# Patient Record
Sex: Female | Born: 1960 | Race: Black or African American | Hispanic: No | Smoking: Former smoker
Health system: Southern US, Community
[De-identification: ages and names within clinical notes are randomized; demographics above are authoritative.]

## PROBLEM LIST (undated history)

## (undated) DIAGNOSIS — I219 Acute myocardial infarction, unspecified: Secondary | ICD-10-CM

## (undated) DIAGNOSIS — E119 Type 2 diabetes mellitus without complications: Secondary | ICD-10-CM

## (undated) DIAGNOSIS — I10 Essential (primary) hypertension: Secondary | ICD-10-CM

## (undated) HISTORY — PX: TUBAL LIGATION: SHX77

---

## 2017-10-15 ENCOUNTER — Other Ambulatory Visit: Payer: Self-pay

## 2017-10-15 ENCOUNTER — Emergency Department: Payer: Self-pay

## 2017-10-15 ENCOUNTER — Emergency Department
Admission: EM | Admit: 2017-10-15 | Discharge: 2017-10-15 | Disposition: A | Discharge status: Home / Self Care | Payer: Self-pay | Attending: Emergency Medicine | Admitting: Emergency Medicine

## 2017-10-15 DIAGNOSIS — I10 Essential (primary) hypertension: Secondary | ICD-10-CM | POA: Insufficient documentation

## 2017-10-15 DIAGNOSIS — E119 Type 2 diabetes mellitus without complications: Secondary | ICD-10-CM | POA: Insufficient documentation

## 2017-10-15 DIAGNOSIS — R531 Weakness: Secondary | ICD-10-CM

## 2017-10-15 DIAGNOSIS — Z87891 Personal history of nicotine dependence: Secondary | ICD-10-CM | POA: Insufficient documentation

## 2017-10-15 DIAGNOSIS — R519 Headache, unspecified: Secondary | ICD-10-CM

## 2017-10-15 DIAGNOSIS — R51 Headache: Secondary | ICD-10-CM | POA: Insufficient documentation

## 2017-10-15 DIAGNOSIS — I252 Old myocardial infarction: Secondary | ICD-10-CM | POA: Insufficient documentation

## 2017-10-15 HISTORY — DX: Acute myocardial infarction, unspecified: I21.9

## 2017-10-15 HISTORY — DX: Type 2 diabetes mellitus without complications: E11.9

## 2017-10-15 HISTORY — DX: Essential (primary) hypertension: I10

## 2017-10-15 LAB — TROPONIN I

## 2017-10-15 LAB — COMPREHENSIVE METABOLIC PANEL
ALT: 16 U/L (ref 14–54)
AST: 23 U/L (ref 15–41)
Albumin: 4.2 g/dL (ref 3.5–5.0)
Alkaline Phosphatase: 69 U/L (ref 38–126)
Anion gap: 11 (ref 5–15)
BILIRUBIN TOTAL: 0.6 mg/dL (ref 0.3–1.2)
BUN: 13 mg/dL (ref 6–20)
CO2: 24 mmol/L (ref 22–32)
CREATININE: 0.7 mg/dL (ref 0.44–1.00)
Calcium: 9.7 mg/dL (ref 8.9–10.3)
Chloride: 103 mmol/L (ref 101–111)
GFR calc Af Amer: >60 mL/min (ref >60)
Glucose, Bld: 189 mg/dL — ABNORMAL HIGH (ref 65–99)
POTASSIUM: 3.7 mmol/L (ref 3.5–5.1)
Sodium: 138 mmol/L (ref 135–145)
TOTAL PROTEIN: 8.6 g/dL — AB (ref 6.5–8.1)

## 2017-10-15 LAB — CBC
HEMATOCRIT: 38.9 % (ref 35.0–47.0)
Hemoglobin: 12.2 g/dL (ref 12.0–16.0)
MCH: 20.5 pg — AB (ref 26.0–34.0)
MCHC: 31.4 g/dL — AB (ref 32.0–36.0)
MCV: 65.4 fL — AB (ref 80.0–100.0)
Platelets: 180 10*3/uL (ref 150–440)
RBC: 5.95 MIL/uL — ABNORMAL HIGH (ref 3.80–5.20)
RDW: 16.8 % — ABNORMAL HIGH (ref 11.5–14.5)
WBC: 6.4 10*3/uL (ref 3.6–11.0)

## 2017-10-15 LAB — APTT: APTT: 30 s (ref 24–36)

## 2017-10-15 LAB — DIFFERENTIAL
BASOS ABS: 0.1 10*3/uL (ref 0–0.1)
Basophils Relative: 1 %
EOS ABS: 0.2 10*3/uL (ref 0–0.7)
Eosinophils Relative: 3 %
Lymphocytes Relative: 37 %
Lymphs Abs: 2.4 10*3/uL (ref 1.0–3.6)
MONO ABS: 0.6 10*3/uL (ref 0.2–0.9)
MONOS PCT: 9 %
Neutro Abs: 3.2 10*3/uL (ref 1.4–6.5)
Neutrophils Relative %: 50 %

## 2017-10-15 LAB — PROTIME-INR
INR: 1
Prothrombin Time: 13.1 seconds (ref 11.4–15.2)

## 2017-10-15 LAB — GLUCOSE, CAPILLARY: Glucose-Capillary: 182 mg/dL — ABNORMAL HIGH (ref 65–99)

## 2017-10-15 MED ORDER — PROCHLORPERAZINE EDISYLATE 5 MG/ML IJ SOLN
10.0000 mg | Freq: Once | INTRAMUSCULAR | Status: AC
  Administered 2017-10-15: 10 mg via INTRAVENOUS
  Filled 2017-10-15: qty 2

## 2017-10-15 MED ORDER — SODIUM CHLORIDE 0.9 % IV BOLUS (SEPSIS)
1000.0000 mL | Freq: Once | INTRAVENOUS | Status: AC
  Administered 2017-10-15: 1000 mL via INTRAVENOUS

## 2017-10-15 NOTE — ED Triage Notes (Signed)
Pt states L sided weakness from "head to toes." noticed when she woke up at 2am. States HA x 2 days. States has been talking aleeve States blurred vision while driving.   Alert, oriented, no speech problems noted. bilat grip strong. Able to raise both arms and keep them up. L leg falls back to bed but able to pick it back up again. No problems noted with R leg.   Takes 81mg  asa.

## 2017-10-15 NOTE — ED Notes (Signed)
Patient states her periods stopped when she was 51 and hasn't had intercourse in 6 years.

## 2017-10-15 NOTE — ED Notes (Signed)
Patient is back from CT scan.

## 2017-10-15 NOTE — ED Notes (Signed)
Pt states she takes BP medications but has been out of them x 1 month.

## 2017-10-15 NOTE — Discharge Instructions (Signed)
Please seek medical attention for any high fevers, chest pain, shortness of breath, change in behavior, persistent vomiting, bloody stool or any other new or concerning symptoms.  

## 2017-10-15 NOTE — ED Notes (Signed)
Patient ambulated with tech. No dyspnea or problems reported.

## 2017-10-15 NOTE — ED Provider Notes (Signed)
[facility] Emergency Department Provider Note  ____________________________________________   I have reviewed the triage vital signs and the nursing notes.   HISTORY  Chief Complaint Weakness and Hypertension   History limited by: Not Limited   HPI Penny Medina is a 57 y.o. female who presents to the emergency department today because of concern for headache and left sided weakness. The patient states that the headaches started two days ago. Initially it was located behind her eyes but it has since moved to the top of her head. It has increasingly gotten more severe. She tried taking OTC pain medications for it but has not gotten relief. Last time she had a headache this severe was in 2006. She states that this morning at 2 am she woke up and noticed that she had difficulty getting out of bed and walking because of left sided weakness.   Per medical record review patient has a history of DM, MI, HTN.  Past Medical History:  Diagnosis Date  . Diabetes mellitus without complication (HCC)   . Heart attack (HCC)   . Hypertension     There are no active problems to display for this patient.   Past Surgical History:  Procedure Laterality Date  . CESAREAN SECTION    . TUBAL LIGATION      Prior to Admission medications   Not on File    Allergies Penicillins  History reviewed. No pertinent family history.  Social History Social History   Tobacco Use  . Smoking status: Former Smoker  Substance Use Topics  . Alcohol use: No    Frequency: Never  . Drug use: Not on file    Review of Systems Constitutional: No fever/chills Eyes: No visual changes. ENT: No sore throat. Cardiovascular: Denies chest pain. Respiratory: Denies shortness of breath. Gastrointestinal: No abdominal pain.  No nausea, no vomiting.  No diarrhea.   Genitourinary: Negative for dysuria. Musculoskeletal: Negative for back pain. Skin: Negative for rash. Neurological:  Positive for headache, left sided weakness.  ____________________________________________   PHYSICAL EXAM:  VITAL SIGNS: ED Triage Vitals  Enc Vitals Group     BP 10/15/17 1239 (!) 214/119     Pulse Rate 10/15/17 1239 91     Resp 10/15/17 1239 13     Temp 10/15/17 1239 98.6 F (37 C)     Temp Source 10/15/17 1239 Oral     SpO2 10/15/17 1239 100 %     Weight 10/15/17 1235 278 lb 1.6 oz (126.1 kg)     Height 10/15/17 1235 5\' 5"  (1.651 m)     Head Circumference --      Peak Flow --      Pain Score 10/15/17 1235 10   Constitutional: Alert and oriented. Well appearing and in no distress. Eyes: Conjunctivae are normal.  ENT   Head: Normocephalic and atraumatic.   Nose: No congestion/rhinnorhea.   Mouth/Throat: Mucous membranes are moist.   Neck: No stridor. Hematological/Lymphatic/Immunilogical: No cervical lymphadenopathy. Cardiovascular: Normal rate, regular rhythm.  No murmurs, rubs, or gallops.  Respiratory: Normal respiratory effort without tachypnea nor retractions. Breath sounds are clear and equal bilaterally. No wheezes/rales/rhonchi. Gastrointestinal: Soft and non tender. No rebound. No guarding.  Genitourinary: Deferred Musculoskeletal: Normal range of motion in all extremities. No lower extremity edema. Neurologic:  Normal speech and language. No pronator drift. Strength 5/5 in right upper and lower extremities. 4+/5 in left upper and lower extremities. Sensation grossly intact.  Skin:  Skin is warm, dry and intact. No  rash noted. Psychiatric: Mood and affect are normal. Speech and behavior are normal. Patient exhibits appropriate insight and judgment.  ____________________________________________    LABS (pertinent positives/negatives)  Trop <0.03 CBC wbc 6.4, hgb 12.2, plt 180 CMP glu 189, cr 0.70  ____________________________________________   EKG  None  ____________________________________________    RADIOLOGY  CT head No acute  abnormality  ____________________________________________   PROCEDURES  Procedures  ____________________________________________   INITIAL IMPRESSION / ASSESSMENT AND PLAN / ED COURSE  Pertinent labs & imaging results that were available during my care of the patient were reviewed by me and considered in my medical decision making (see chart for details).  Resented to the emergency department today with concerns for severe headache as well as some left-sided numbness.  Patient has had a headache similar to this in the past although has been a number of years.  The patient was given IV fluids and medication.  This did help resolve the headache.  Patient stated she did feel better and the strength had returned to her arm and leg.  She was able to get up and walk around the room easily.  I did offer MRI to the patient to evaluate for stroke however she felt comfortable deferring.  We did discuss return precautions.  ___________________________________   FINAL CLINICAL IMPRESSION(S) / ED DIAGNOSES  Final diagnoses:  Bad headache  Left-sided weakness     Note: This dictation was prepared with Dragon dictation. Any transcriptional errors that result from this process are unintentional     Phineas SemenGoodman, Pretty Weltman, MD 10/15/17 [phone]

## 2017-10-23 ENCOUNTER — Emergency Department
Admission: EM | Admit: 2017-10-23 | Discharge: 2017-10-23 | Disposition: A | Discharge status: Home / Self Care | Payer: Self-pay | Attending: Emergency Medicine | Admitting: Emergency Medicine

## 2017-10-23 ENCOUNTER — Other Ambulatory Visit: Payer: Self-pay

## 2017-10-23 DIAGNOSIS — Z79899 Other long term (current) drug therapy: Secondary | ICD-10-CM | POA: Insufficient documentation

## 2017-10-23 DIAGNOSIS — I10 Essential (primary) hypertension: Secondary | ICD-10-CM | POA: Insufficient documentation

## 2017-10-23 DIAGNOSIS — L0291 Cutaneous abscess, unspecified: Secondary | ICD-10-CM

## 2017-10-23 DIAGNOSIS — E119 Type 2 diabetes mellitus without complications: Secondary | ICD-10-CM | POA: Insufficient documentation

## 2017-10-23 DIAGNOSIS — Z7982 Long term (current) use of aspirin: Secondary | ICD-10-CM | POA: Insufficient documentation

## 2017-10-23 DIAGNOSIS — Z7984 Long term (current) use of oral hypoglycemic drugs: Secondary | ICD-10-CM | POA: Insufficient documentation

## 2017-10-23 DIAGNOSIS — L02211 Cutaneous abscess of abdominal wall: Secondary | ICD-10-CM | POA: Insufficient documentation

## 2017-10-23 DIAGNOSIS — Z87891 Personal history of nicotine dependence: Secondary | ICD-10-CM | POA: Insufficient documentation

## 2017-10-23 LAB — CBC WITH DIFFERENTIAL/PLATELET
BASOS ABS: 0 10*3/uL (ref 0–0.1)
BASOS PCT: 1 %
Eosinophils Absolute: 0.2 10*3/uL (ref 0–0.7)
Eosinophils Relative: 2 %
HCT: 39.8 % (ref 35.0–47.0)
HEMOGLOBIN: 12.6 g/dL (ref 12.0–16.0)
Lymphocytes Relative: 28 %
Lymphs Abs: 2.2 10*3/uL (ref 1.0–3.6)
MCH: 21.1 pg — ABNORMAL LOW (ref 26.0–34.0)
MCHC: 31.7 g/dL — ABNORMAL LOW (ref 32.0–36.0)
MCV: 66.7 fL — ABNORMAL LOW (ref 80.0–100.0)
Monocytes Absolute: 0.7 10*3/uL (ref 0.2–0.9)
Monocytes Relative: 10 %
NEUTROS PCT: 59 %
Neutro Abs: 4.6 10*3/uL (ref 1.4–6.5)
Platelets: 185 10*3/uL (ref 150–440)
RBC: 5.97 MIL/uL — AB (ref 3.80–5.20)
RDW: 17.1 % — ABNORMAL HIGH (ref 11.5–14.5)
WBC: 7.7 10*3/uL (ref 3.6–11.0)

## 2017-10-23 LAB — BASIC METABOLIC PANEL
ANION GAP: 13 (ref 5–15)
BUN: 12 mg/dL (ref 6–20)
CALCIUM: 9.2 mg/dL (ref 8.9–10.3)
CHLORIDE: 104 mmol/L (ref 101–111)
CO2: 20 mmol/L — AB (ref 22–32)
Creatinine, Ser: 0.7 mg/dL (ref 0.44–1.00)
GFR calc non Af Amer: >60 mL/min (ref >60)
Glucose, Bld: 174 mg/dL — ABNORMAL HIGH (ref 65–99)
POTASSIUM: 3.8 mmol/L (ref 3.5–5.1)
Sodium: 137 mmol/L (ref 135–145)

## 2017-10-23 MED ORDER — CLINDAMYCIN HCL 150 MG PO CAPS: 300.0000 mg | ORAL_CAPSULE | Freq: Three times a day (TID) | ORAL | 0 refills | Status: DC

## 2017-10-23 MED ORDER — TRAMADOL HCL 50 MG PO TABS: 50.0000 mg | ORAL_TABLET | Freq: Four times a day (QID) | ORAL | 0 refills | Status: DC | PRN

## 2017-10-23 MED ORDER — CLINDAMYCIN PHOSPHATE 600 MG/50ML IV SOLN
600.0000 mg | Freq: Once | INTRAVENOUS | Status: AC
  Administered 2017-10-23: 600 mg via INTRAVENOUS
  Filled 2017-10-23: qty 50

## 2017-10-23 MED ORDER — KETOROLAC TROMETHAMINE 30 MG/ML IJ SOLN
30.0000 mg | Freq: Once | INTRAMUSCULAR | Status: AC
  Administered 2017-10-23: 30 mg via INTRAVENOUS
  Filled 2017-10-23: qty 1

## 2017-10-23 NOTE — ED Notes (Signed)
FIRST NURSE NOTE:  Pt states she has abscess or cyst to abdomen since Friday, pt states she used hot compress for sxs and states today it popped and started draining.

## 2017-10-23 NOTE — Discharge Instructions (Signed)
Follow-up with your regular doctor as a acute care if you are not better in 3-5 days.  Return to the emergency department if you are worsening.  Apply warm compress to the area.  Take medications as prescribed.

## 2017-10-23 NOTE — ED Triage Notes (Signed)
Pt states noticed abscess to LLQ (states belly but points to groin). States noticed it Friday. States swelling yesterday. States while driving here it burst and it drained. Denies fevers. Alert, oriented, ambulatory.

## 2017-10-23 NOTE — ED Notes (Signed)
See triage note  Presents with 2 possible abscess area to lower abd  Areas are starting to drain

## 2017-10-23 NOTE — ED Provider Notes (Signed)
[facility] Emergency Department Provider Note  ____________________________________________   First MD Initiated Contact with Patient 10/23/17 1416     (approximate)  I have reviewed the triage vital signs and the nursing notes.   HISTORY  Chief Complaint Abscess    HPI Penny Medina is a 57 y.o. female presents to the emergency department complaining of abscess to the abdomen.  She states the area started to drain earlier today.  She is not had any fever or chills.  She does have a lot of pain in this area.  She has a history of diabetes.  She is concerned that the area will continue to spread  Past Medical History:  Diagnosis Date  . Diabetes mellitus without complication ([place])   . Heart attack ([place])   . Hypertension     There are no active problems to display for this patient.   Past Surgical History:  Procedure Laterality Date  . CESAREAN SECTION    . TUBAL LIGATION      Prior to Admission medications   Medication Sig Start Date End Date Taking? Authorizing Provider  amLODipine (NORVASC) 10 MG tablet Take 10 mg by mouth daily.   Yes [provider]  aspirin 81 MG chewable tablet Chew 81 mg by mouth daily.   Yes [provider]  carvedilol (COREG) 12.5 MG tablet Take 12.5 mg by mouth 2 (two) times daily with a meal.   Yes [provider]  glimepiride (AMARYL) 2 MG tablet Take 2 mg by mouth daily with breakfast.   Yes [provider]  metFORMIN (GLUCOPHAGE) 500 MG tablet Take 500 mg by mouth 2 (two) times daily with a meal.   Yes [provider]  ranitidine (ZANTAC) 75 MG tablet Take 75 mg by mouth 2 (two) times daily.   Yes [provider]  clindamycin (CLEOCIN) 150 MG capsule Take 2 capsules (300 mg total) by mouth 3 (three) times daily. 10/23/17   Tela Kotecki, Linden Dolin, PA-C  traMADol (ULTRAM) 50 MG tablet Take 1 tablet (50 mg total) by mouth every 6 (six) hours as needed. 10/23/17   Versie Starks, PA-C    Allergies Penicillins and Sulfa antibiotics  History reviewed. No pertinent family history.  Social History Social History   Tobacco Use  . Smoking status: Former Smoker  Substance Use Topics  . Alcohol use: No    Frequency: Never  . Drug use: Not on file    Review of Systems  Constitutional: No fever/chills Eyes: No visual changes. ENT: No sore throat. Respiratory: Denies cough Genitourinary: Negative for dysuria. Musculoskeletal: Negative for back pain. Skin: Negative for rash.  Positive for abscess on the abdomen    ____________________________________________   PHYSICAL EXAM:  VITAL SIGNS: ED Triage Vitals  Enc Vitals Group     BP 10/23/17 1229 (!) 144/69     Pulse Rate 10/23/17 1229 89     Resp 10/23/17 1229 20     Temp 10/23/17 1229 98.8 F (37.1 C)     Temp Source 10/23/17 1229 Oral     SpO2 10/23/17 1229 100 %     Weight 10/23/17 1230 260 lb (117.9 kg)     Height 10/23/17 1230 _0  (1.651 m)     Head Circumference --      Peak Flow --      Pain Score 10/23/17 1232 6     Pain Loc --      Pain Edu? --  Excl. in [place]? --     Constitutional: Alert and oriented. Well appearing and in no acute distress. Eyes: Conjunctivae are normal.  Head: Atraumatic. Nose: No congestion/rhinnorhea. Mouth/Throat: Mucous membranes are moist.   Cardiovascular: Normal rate, regular rhythm.  Heart sounds are normal Respiratory: Normal respiratory effort.  No retractions, lungs are clear to auscultation Abdomen: There is a large abscess that is currently draining below the umbilicus in the fold of the pannus.  The area is tender to palpation GU: deferred Musculoskeletal: FROM all extremities, warm and well perfused Neurologic:  Normal speech and language.  Skin:  Skin is warm, dry and intact. No rash noted. Psychiatric: Mood and affect are normal. Speech and behavior are normal.  ____________________________________________   LABS (all labs  ordered are listed, but only abnormal results are displayed)  Labs Reviewed  CBC WITH DIFFERENTIAL/PLATELET - Abnormal; Notable for the following components:      Result Value   RBC 5.97 (*)    MCV 66.7 (*)    MCH 21.1 (*)    MCHC 31.7 (*)    RDW 17.1 (*)    All other components within normal limits  BASIC METABOLIC PANEL - Abnormal; Notable for the following components:   CO2 20 (*)    Glucose, Bld 174 (*)    All other components within normal limits   ____________________________________________   ____________________________________________  RADIOLOGY    ____________________________________________   PROCEDURES  Procedure(s) performed: Saline lock, clindamycin 600 mg IV, Toradol 30 mg IV  Procedures    ____________________________________________   INITIAL IMPRESSION / ASSESSMENT AND PLAN / ED COURSE  Pertinent labs & imaging results that were available during my care of the patient were reviewed by me and considered in my medical decision making (see chart for details).  Patient is a 57 year old female presents emergency department complaining of an abscess on the lower abdomen  On physical exam there is a large red swollen tender area with an obvious abscess that is currently draining  Saline lock, clindamycin 600 mg IV and Toradol 30 mg IV were given to the patient.  CBC has a normal white count, the met B has normal kidney functions and her glucose was slightly elevated but within normal limits for the patient  Results were discussed with the patient.  She was given a prescription for clindamycin and tramadol.  She is to follow-up with her regular doctor if not improving in 3 days.  If she is worsening she should return to the emergency department.  She is to apply a dressing to absorb the drainage.  She states she understands will comply with our instructions.  She was discharged in stable condition     As part of my medical decision making, I reviewed  the following data within the [place] notes reviewed and incorporated, Labs reviewed CBC and met B are basically normal, Notes from prior ED visits and [place] Controlled Substance Database  ____________________________________________   FINAL CLINICAL IMPRESSION(S) / ED DIAGNOSES  Final diagnoses:  Abscess      NEW MEDICATIONS STARTED DURING THIS VISIT:  New Prescriptions   CLINDAMYCIN (CLEOCIN) 150 MG CAPSULE    Take 2 capsules (300 mg total) by mouth 3 (three) times daily.   TRAMADOL (ULTRAM) 50 MG TABLET    Take 1 tablet (50 mg total) by mouth every 6 (six) hours as needed.     Note:  This document was prepared using Dragon voice recognition software and may include unintentional  dictation errors.    Versie Starks, PA-C 10/23/17 1623    Lisa Roca, MD 10/26/17 [phone]

## 2017-10-23 NOTE — ED Notes (Signed)

## 2018-06-20 ENCOUNTER — Other Ambulatory Visit: Payer: Self-pay

## 2018-06-20 ENCOUNTER — Emergency Department
Admission: EM | Admit: 2018-06-20 | Discharge: 2018-06-20 | Disposition: A | Discharge status: Home / Self Care | Payer: Self-pay | Attending: Emergency Medicine | Admitting: Emergency Medicine

## 2018-06-20 ENCOUNTER — Encounter: Payer: Self-pay | Admitting: Emergency Medicine

## 2018-06-20 DIAGNOSIS — Z7984 Long term (current) use of oral hypoglycemic drugs: Secondary | ICD-10-CM | POA: Insufficient documentation

## 2018-06-20 DIAGNOSIS — A599 Trichomoniasis, unspecified: Secondary | ICD-10-CM | POA: Insufficient documentation

## 2018-06-20 DIAGNOSIS — E119 Type 2 diabetes mellitus without complications: Secondary | ICD-10-CM | POA: Insufficient documentation

## 2018-06-20 DIAGNOSIS — Z79899 Other long term (current) drug therapy: Secondary | ICD-10-CM | POA: Insufficient documentation

## 2018-06-20 DIAGNOSIS — I10 Essential (primary) hypertension: Secondary | ICD-10-CM | POA: Insufficient documentation

## 2018-06-20 DIAGNOSIS — Z87891 Personal history of nicotine dependence: Secondary | ICD-10-CM | POA: Insufficient documentation

## 2018-06-20 DIAGNOSIS — N739 Female pelvic inflammatory disease, unspecified: Secondary | ICD-10-CM | POA: Insufficient documentation

## 2018-06-20 LAB — URINALYSIS, COMPLETE (UACMP) WITH MICROSCOPIC
Bilirubin Urine: NEGATIVE
GLUCOSE, UA: 50 mg/dL — AB
HGB URINE DIPSTICK: NEGATIVE
Ketones, ur: NEGATIVE mg/dL
NITRITE: NEGATIVE
PROTEIN: NEGATIVE mg/dL
Specific Gravity, Urine: 1.021 (ref 1.005–1.030)
pH: 5 (ref 5.0–8.0)

## 2018-06-20 LAB — CBC WITH DIFFERENTIAL/PLATELET
ABS IMMATURE GRANULOCYTES: 0.02 10*3/uL (ref 0.00–0.07)
BASOS ABS: 0 10*3/uL (ref 0.0–0.1)
BASOS PCT: 1 %
Eosinophils Absolute: 0.2 10*3/uL (ref 0.0–0.5)
Eosinophils Relative: 4 %
HCT: 42.1 % (ref 36.0–46.0)
HEMOGLOBIN: 12.6 g/dL (ref 12.0–15.0)
IMMATURE GRANULOCYTES: 0 %
Lymphocytes Relative: 35 %
Lymphs Abs: 2.2 10*3/uL (ref 0.7–4.0)
MCH: 20.8 pg — ABNORMAL LOW (ref 26.0–34.0)
MCHC: 29.9 g/dL — ABNORMAL LOW (ref 30.0–36.0)
MCV: 69.5 fL — ABNORMAL LOW (ref 80.0–100.0)
MONOS PCT: 7 %
Monocytes Absolute: 0.4 10*3/uL (ref 0.1–1.0)
NEUTROS PCT: 53 %
NRBC: 0 % (ref 0.0–0.2)
Neutro Abs: 3.3 10*3/uL (ref 1.7–7.7)
Platelets: 192 10*3/uL (ref 150–400)
RBC: 6.06 MIL/uL — ABNORMAL HIGH (ref 3.87–5.11)
RDW: 15.5 % (ref 11.5–15.5)
WBC: 6.2 10*3/uL (ref 4.0–10.5)

## 2018-06-20 LAB — COMPREHENSIVE METABOLIC PANEL
ALBUMIN: 4.1 g/dL (ref 3.5–5.0)
ALK PHOS: 80 U/L (ref 38–126)
ALT: 15 U/L (ref 0–44)
AST: 24 U/L (ref 15–41)
Anion gap: 7 (ref 5–15)
BUN: 6 mg/dL (ref 6–20)
CALCIUM: 9.4 mg/dL (ref 8.9–10.3)
CO2: 26 mmol/L (ref 22–32)
CREATININE: 0.76 mg/dL (ref 0.44–1.00)
Chloride: 107 mmol/L (ref 98–111)
GFR calc non Af Amer: >60 mL/min (ref >60)
Glucose, Bld: 267 mg/dL — ABNORMAL HIGH (ref 70–99)
Potassium: 3.4 mmol/L — ABNORMAL LOW (ref 3.5–5.1)
SODIUM: 140 mmol/L (ref 135–145)
Total Bilirubin: 0.4 mg/dL (ref 0.3–1.2)
Total Protein: 8.3 g/dL — ABNORMAL HIGH (ref 6.5–8.1)

## 2018-06-20 LAB — CHLAMYDIA/NGC RT PCR (ARMC ONLY)
Chlamydia Tr: NOT DETECTED
N GONORRHOEAE: NOT DETECTED

## 2018-06-20 LAB — WET PREP, GENITAL
Clue Cells Wet Prep HPF POC: NONE SEEN
Sperm: NONE SEEN
Yeast Wet Prep HPF POC: NONE SEEN

## 2018-06-20 LAB — LIPASE, BLOOD: Lipase: 53 U/L — ABNORMAL HIGH (ref 11–51)

## 2018-06-20 MED ORDER — CEFTRIAXONE SODIUM 250 MG IJ SOLR
250.0000 mg | Freq: Once | INTRAMUSCULAR | Status: AC
  Administered 2018-06-20: 250 mg via INTRAMUSCULAR
  Filled 2018-06-20: qty 250

## 2018-06-20 MED ORDER — SODIUM CHLORIDE 0.9 % IV BOLUS
1000.0000 mL | Freq: Once | INTRAVENOUS | Status: AC
  Administered 2018-06-20: 1000 mL via INTRAVENOUS

## 2018-06-20 MED ORDER — DOXYCYCLINE HYCLATE 100 MG PO TABS
100.0000 mg | ORAL_TABLET | Freq: Once | ORAL | Status: AC
  Administered 2018-06-20: 100 mg via ORAL
  Filled 2018-06-20: qty 1

## 2018-06-20 MED ORDER — METRONIDAZOLE 500 MG PO TABS: 500.0000 mg | ORAL_TABLET | Freq: Two times a day (BID) | ORAL | 0 refills | Status: AC

## 2018-06-20 MED ORDER — METRONIDAZOLE 500 MG PO TABS
500.0000 mg | ORAL_TABLET | Freq: Once | ORAL | Status: AC
  Administered 2018-06-20: 500 mg via ORAL
  Filled 2018-06-20: qty 1

## 2018-06-20 MED ORDER — DOXYCYCLINE HYCLATE 100 MG PO CAPS: 100.0000 mg | ORAL_CAPSULE | Freq: Two times a day (BID) | ORAL | 0 refills | Status: DC

## 2018-06-20 NOTE — ED Provider Notes (Signed)
[facility] Emergency Department Provider Note  ____________________________________________   First MD Initiated Contact with Patient 06/20/18 1634     (approximate)  I have reviewed the triage vital signs and the nursing notes.   HISTORY  Chief Complaint Abscess   HPI Penny Medina is a 57 y.o. female with a history of diabetes, hypertension and MI who was presented emergency department complaining of a "abscess."  She states that she feels like it is inside of her stomach.  She states that she has had multiple abscess to the lower abdomen under the folds of her pannus.  She states that when she wipes after urinating she is also had a foul-smelling discharge and believes that something is draining down to her vagina.  She denies having sex in the last 7 years.  Says that it feels like a tightness to her lower abdomen.  No nausea vomiting or diarrhea.   Past Medical History:  Diagnosis Date  . Diabetes mellitus without complication (HCC)   . Heart attack (HCC)   . Hypertension     There are no active problems to display for this patient.   Past Surgical History:  Procedure Laterality Date  . CESAREAN SECTION    . TUBAL LIGATION      Prior to Admission medications   Medication Sig Start Date End Date Taking? Authorizing Provider  amLODipine (NORVASC) 10 MG tablet Take 10 mg by mouth daily.    [provider]  aspirin 81 MG chewable tablet Chew 81 mg by mouth daily.    [provider]  carvedilol (COREG) 12.5 MG tablet Take 12.5 mg by mouth 2 (two) times daily with a meal.    [provider]  clindamycin (CLEOCIN) 150 MG capsule Take 2 capsules (300 mg total) by mouth 3 (three) times daily. 10/23/17   Fisher, Roselyn Bering, PA-C  glimepiride (AMARYL) 2 MG tablet Take 2 mg by mouth daily with breakfast.    [provider]  metFORMIN (GLUCOPHAGE) 500 MG tablet Take 500 mg by mouth 2 (two) times daily with a meal.     [provider]  ranitidine (ZANTAC) 75 MG tablet Take 75 mg by mouth 2 (two) times daily.    [provider]  traMADol (ULTRAM) 50 MG tablet Take 1 tablet (50 mg total) by mouth every 6 (six) hours as needed. 10/23/17   Faythe Ghee, PA-C    Allergies Penicillins and Sulfa antibiotics  No family history on file.  Social History Social History   Tobacco Use  . Smoking status: Former Smoker  Substance Use Topics  . Alcohol use: No    Frequency: Never  . Drug use: Not on file    Review of Systems  Constitutional: No fever/chills Eyes: No visual changes. ENT: No sore throat. Cardiovascular: Denies chest pain. Respiratory: Denies shortness of breath. Gastrointestinal:  No nausea, no vomiting.  No diarrhea.  No constipation. Genitourinary: Negative for dysuria. Musculoskeletal: Negative for back pain. Skin: Negative for rash. Neurological: Negative for headaches, focal weakness or numbness.   ____________________________________________   PHYSICAL EXAM:  VITAL SIGNS: ED Triage Vitals  Enc Vitals Group     BP 06/20/18 1604 (!) 188/84     Pulse Rate 06/20/18 1604 (!) 103     Resp 06/20/18 1604 20     Temp 06/20/18 1604 98.7 F (37.1 C)     Temp Source 06/20/18 1604 Oral     SpO2 06/20/18 1604 97 %  Weight 06/20/18 1604 270 lb (122.5 kg)     Height 06/20/18 1604 5\' 5"  (1.651 m)     Head Circumference --      Peak Flow --      Pain Score 06/20/18 1610 6     Pain Loc --      Pain Edu? --      Excl. in [place]? --     Constitutional: Alert and oriented. Well appearing and in no acute distress. Eyes: Conjunctivae are normal.  Head: Atraumatic. Nose: No congestion/rhinnorhea. Mouth/Throat: Mucous membranes are moist.  Neck: No stridor.   Cardiovascular: Normal rate, regular rhythm. Grossly normal heart sounds.  Good peripheral circulation. Respiratory: Normal respiratory effort.  No retractions. Lungs CTAB. Gastrointestinal: Soft and nontender.  No distention. No CVA tenderness. Genitourinary: Normal external exam without any lesions.  Speculum exam with a yellow to greenish discharge at the cervix.  Cervix is friable.  No CMT.  No uterine or adnexal tenderness to palpation. Musculoskeletal: No lower extremity tenderness nor edema.  No joint effusions. Neurologic:  Normal speech and language. No gross focal neurologic deficits are appreciated. Skin:  Skin is warm, dry and intact. No rash noted. Psychiatric: Mood and affect are normal. Speech and behavior are normal.  ____________________________________________   LABS (all labs ordered are listed, but only abnormal results are displayed)  Labs Reviewed  WET PREP, GENITAL - Abnormal; Notable for the following components:      Result Value   Trich, Wet Prep PRESENT (*)    WBC, Wet Prep HPF POC MANY (*)    All other components within normal limits  CBC WITH DIFFERENTIAL/PLATELET - Abnormal; Notable for the following components:   RBC 6.06 (*)    MCV 69.5 (*)    MCH 20.8 (*)    MCHC 29.9 (*)    All other components within normal limits  COMPREHENSIVE METABOLIC PANEL - Abnormal; Notable for the following components:   Potassium 3.4 (*)    Glucose, Bld 267 (*)    Total Protein 8.3 (*)    All other components within normal limits  LIPASE, BLOOD - Abnormal; Notable for the following components:   Lipase 53 (*)    All other components within normal limits  URINALYSIS, COMPLETE (UACMP) WITH MICROSCOPIC - Abnormal; Notable for the following components:   Color, Urine YELLOW (*)    APPearance CLEAR (*)    Glucose, UA 50 (*)    Leukocytes, UA LARGE (*)    Bacteria, UA RARE (*)    All other components within normal limits  CHLAMYDIA/NGC RT PCR ([facility] ONLY)   ____________________________________________  EKG   ____________________________________________  RADIOLOGY   ____________________________________________   PROCEDURES  Procedure(s) performed:    Procedures  Critical Care performed:   ____________________________________________   INITIAL IMPRESSION / ASSESSMENT AND PLAN / ED COURSE  Pertinent labs & imaging results that were available during my care of the patient were reviewed by me and considered in my medical decision making (see chart for details).  Differential diagnosis includes, but is not limited to, ovarian cyst, ovarian torsion, acute appendicitis, diverticulitis, urinary tract infection/pyelonephritis, endometriosis, bowel obstruction, colitis, renal colic, gastroenteritis, hernia, fibroids, endometriosis, pregnancy related pain including ectopic pregnancy, etc. As part of my medical decision making, I reviewed the following data within the electronic MEDICAL RECORD NUMBER Notes from prior ED visits  ----------------------------------------- 7:08 PM on 06/20/2018 -----------------------------------------  Patient with wet prep positive for trichomonas.  Patient will be treated for PID.  She is  aware of the diagnosis as well as need for follow-up for further treatment for other STDs including HIV, syphilis and herpes.  Possibly dormant infection becoming active at this time.  Patient says that she has not had intercourse in 7 years.  Patient understands that she may not have intercourse until she is further tested and cleared by provider. ____________________________________________   FINAL CLINICAL IMPRESSION(S) / ED DIAGNOSES  Pelvic inflammatory disease.  Trichomonas.  NEW MEDICATIONS STARTED DURING THIS VISIT:  New Prescriptions   No medications on file     Note:  This document was prepared using Dragon voice recognition software and may include unintentional dictation errors.     Myrna Blazer, MD 06/20/18 [phone]

## 2018-06-20 NOTE — ED Triage Notes (Signed)
Patient reports history of abscess. States that she cannot see anything on the outside, however, every time she uses the restroom and wipes, she notes a puss-like substance that is foul smelling. Patient also reports pain in abdomen. Denies N/V/D. Denies any fever.

## 2019-07-26 ENCOUNTER — Encounter: Payer: Self-pay | Admitting: Radiology

## 2019-07-26 ENCOUNTER — Emergency Department: Payer: Self-pay

## 2019-07-26 ENCOUNTER — Emergency Department
Admission: EM | Admit: 2019-07-26 | Discharge: 2019-07-26 | Disposition: A | Discharge status: Home / Self Care | Payer: Self-pay | Attending: Emergency Medicine | Admitting: Emergency Medicine

## 2019-07-26 ENCOUNTER — Other Ambulatory Visit: Payer: Self-pay

## 2019-07-26 DIAGNOSIS — M4802 Spinal stenosis, cervical region: Secondary | ICD-10-CM | POA: Insufficient documentation

## 2019-07-26 DIAGNOSIS — M5412 Radiculopathy, cervical region: Secondary | ICD-10-CM | POA: Insufficient documentation

## 2019-07-26 DIAGNOSIS — Z7982 Long term (current) use of aspirin: Secondary | ICD-10-CM | POA: Insufficient documentation

## 2019-07-26 DIAGNOSIS — E119 Type 2 diabetes mellitus without complications: Secondary | ICD-10-CM | POA: Insufficient documentation

## 2019-07-26 DIAGNOSIS — Z7984 Long term (current) use of oral hypoglycemic drugs: Secondary | ICD-10-CM | POA: Insufficient documentation

## 2019-07-26 DIAGNOSIS — I10 Essential (primary) hypertension: Secondary | ICD-10-CM | POA: Insufficient documentation

## 2019-07-26 DIAGNOSIS — R202 Paresthesia of skin: Secondary | ICD-10-CM

## 2019-07-26 DIAGNOSIS — R200 Anesthesia of skin: Secondary | ICD-10-CM | POA: Insufficient documentation

## 2019-07-26 DIAGNOSIS — Z87891 Personal history of nicotine dependence: Secondary | ICD-10-CM | POA: Insufficient documentation

## 2019-07-26 DIAGNOSIS — Z79899 Other long term (current) drug therapy: Secondary | ICD-10-CM | POA: Insufficient documentation

## 2019-07-26 LAB — COMPREHENSIVE METABOLIC PANEL
ALT: 14 U/L (ref 0–44)
AST: 17 U/L (ref 15–41)
Albumin: 4.1 g/dL (ref 3.5–5.0)
Alkaline Phosphatase: 66 U/L (ref 38–126)
Anion gap: 9 (ref 5–15)
BUN: 10 mg/dL (ref 6–20)
CO2: 25 mmol/L (ref 22–32)
Calcium: 9.3 mg/dL (ref 8.9–10.3)
Chloride: 104 mmol/L (ref 98–111)
Creatinine, Ser: 0.71 mg/dL (ref 0.44–1.00)
GFR calc Af Amer: >60 mL/min (ref >60)
GFR calc non Af Amer: >60 mL/min (ref >60)
Glucose, Bld: 219 mg/dL — ABNORMAL HIGH (ref 70–99)
Potassium: 3.5 mmol/L (ref 3.5–5.1)
Sodium: 138 mmol/L (ref 135–145)
Total Bilirubin: 0.8 mg/dL (ref 0.3–1.2)
Total Protein: 8.3 g/dL — ABNORMAL HIGH (ref 6.5–8.1)

## 2019-07-26 LAB — CBC
HCT: 38.3 % (ref 36.0–46.0)
Hemoglobin: 11.9 g/dL — ABNORMAL LOW (ref 12.0–15.0)
MCH: 20.2 pg — ABNORMAL LOW (ref 26.0–34.0)
MCHC: 31.1 g/dL (ref 30.0–36.0)
MCV: 65 fL — ABNORMAL LOW (ref 80.0–100.0)
Platelets: 199 10*3/uL (ref 150–400)
RBC: 5.89 MIL/uL — ABNORMAL HIGH (ref 3.87–5.11)
RDW: 16.2 % — ABNORMAL HIGH (ref 11.5–15.5)
WBC: 5.6 10*3/uL (ref 4.0–10.5)
nRBC: 0 % (ref 0.0–0.2)

## 2019-07-26 LAB — TROPONIN I (HIGH SENSITIVITY)
Troponin I (High Sensitivity): 10 ng/L (ref <18)
Troponin I (High Sensitivity): 11 ng/L (ref <18)

## 2019-07-26 MED ORDER — GABAPENTIN 100 MG PO CAPS
100.0000 mg | ORAL_CAPSULE | Freq: Once | ORAL | Status: AC
  Administered 2019-07-26: 100 mg via ORAL
  Filled 2019-07-26: qty 1

## 2019-07-26 MED ORDER — CLONIDINE HCL 0.1 MG PO TABS
0.1000 mg | ORAL_TABLET | Freq: Once | ORAL | Status: AC
  Administered 2019-07-26: 0.1 mg via ORAL
  Filled 2019-07-26: qty 1

## 2019-07-26 MED ORDER — HYDROMORPHONE HCL 1 MG/ML IJ SOLN
1.0000 mg | Freq: Once | INTRAMUSCULAR | Status: AC
  Administered 2019-07-26: 1 mg via INTRAVENOUS
  Filled 2019-07-26: qty 1

## 2019-07-26 MED ORDER — IOHEXOL 350 MG/ML SOLN
125.0000 mL | Freq: Once | INTRAVENOUS | Status: AC | PRN
  Administered 2019-07-26: 125 mL via INTRAVENOUS

## 2019-07-26 MED ORDER — OXYCODONE-ACETAMINOPHEN 5-325 MG PO TABS: 1.0000 | ORAL_TABLET | Freq: Four times a day (QID) | ORAL | 0 refills | Status: DC | PRN

## 2019-07-26 MED ORDER — OXYCODONE-ACETAMINOPHEN 5-325 MG PO TABS
2.0000 | ORAL_TABLET | Freq: Once | ORAL | Status: AC
  Administered 2019-07-26: 2 via ORAL
  Filled 2019-07-26: qty 2

## 2019-07-26 MED ORDER — GABAPENTIN 100 MG PO CAPS: 100.0000 mg | ORAL_CAPSULE | Freq: Three times a day (TID) | ORAL | 0 refills | Status: DC

## 2019-07-26 MED ORDER — DEXAMETHASONE SODIUM PHOSPHATE 10 MG/ML IJ SOLN
10.0000 mg | Freq: Once | INTRAMUSCULAR | Status: AC
  Administered 2019-07-26: 10 mg via INTRAVENOUS
  Filled 2019-07-26: qty 1

## 2019-07-26 MED ORDER — NAPROXEN 375 MG PO TABS: 375.0000 mg | ORAL_TABLET | Freq: Two times a day (BID) | ORAL | 0 refills | Status: DC

## 2019-07-26 NOTE — ED Provider Notes (Signed)
[facility] Emergency Department Provider Note  ____________________________________________   First MD Initiated Contact with Patient 07/26/19 [phone]     (approximate)  I have reviewed the triage vital signs and the nursing notes.   HISTORY  Chief Complaint Numbness    HPI Penny Medina is a 58 y.o. female  With h/o HTN, DM, CAD here with multiple complaints. Primary issue is what she describes as a sharp, tingling, burning, stabbing pain in her bilateral but R>L hands and arms. Started several days ago as tingling and paresthesias in her right 3-5th fingers, followed by a sensation of tingling and heaviness on her entire right body. Pain is severe, 10/10, worse w/ any movement. It is even painful with nonpainful activities such as washing her hands. She had some mild CP with this, central. She then began to have some tingling in her left hand. Of note, this actually occurred and began after Thanksgiving, and pt thought she had just overused her hands making food. She has had no fever, chills. No RLE numbness but it just feels "off." No h/o stroke. No CP or SOB currently. No other issues.        Past Medical History:  Diagnosis Date   Diabetes mellitus without complication ([place])    Heart attack ([place])    Hypertension     There are no problems to display for this patient.   Past Surgical History:  Procedure Laterality Date   CESAREAN SECTION     TUBAL LIGATION      Prior to Admission medications   Medication Sig Start Date End Date Taking? Authorizing Provider  amLODipine (NORVASC) 10 MG tablet Take 10 mg by mouth daily.    [provider]  aspirin 81 MG chewable tablet Chew 81 mg by mouth daily.    [provider]  carvedilol (COREG) 12.5 MG tablet Take 12.5 mg by mouth 2 (two) times daily with a meal.    [provider]  clindamycin (CLEOCIN) 150 MG capsule Take 2 capsules (300 mg total) by mouth 3 (three) times  daily. 10/23/17   Fisher, Linden Dolin, PA-C  doxycycline (VIBRAMYCIN) 100 MG capsule Take 1 capsule (100 mg total) by mouth 2 (two) times daily. 06/20/18   Schaevitz, Randall An, MD  gabapentin (NEURONTIN) 100 MG capsule Take 1 capsule (100 mg total) by mouth 3 (three) times daily for 10 days. 07/26/19 08/05/19  Duffy Bruce, MD  glimepiride (AMARYL) 2 MG tablet Take 2 mg by mouth daily with breakfast.    [provider]  metFORMIN (GLUCOPHAGE) 500 MG tablet Take 500 mg by mouth 2 (two) times daily with a meal.    [provider]  naproxen (NAPROSYN) 375 MG tablet Take 1 tablet (375 mg total) by mouth 2 (two) times daily with a meal for 5 days. 07/26/19 07/31/19  Duffy Bruce, MD  oxyCODONE-acetaminophen (PERCOCET) 5-325 MG tablet Take 1-2 tablets by mouth every 6 (six) hours as needed for moderate pain or severe pain. 07/26/19 07/25/20  Duffy Bruce, MD  ranitidine (ZANTAC) 75 MG tablet Take 75 mg by mouth 2 (two) times daily.    [provider]  traMADol (ULTRAM) 50 MG tablet Take 1 tablet (50 mg total) by mouth every 6 (six) hours as needed. 10/23/17   Versie Starks, PA-C    Allergies Penicillins and Sulfa antibiotics  No family history on file.  Social History Social History   Tobacco Use   Smoking status: Former Smoker  Substance Use  Topics   Alcohol use: No   Drug use: Not on file    Review of Systems  Review of Systems  Constitutional: Positive for fatigue. Negative for fever.  HENT: Negative for congestion and sore throat.   Eyes: Negative for visual disturbance.  Respiratory: Negative for cough and shortness of breath.   Cardiovascular: Positive for chest pain.  Gastrointestinal: Negative for abdominal pain, diarrhea, nausea and vomiting.  Genitourinary: Negative for flank pain.  Musculoskeletal: Positive for arthralgias. Negative for back pain and neck pain.  Skin: Negative for rash and wound.  Neurological: Positive for weakness and  numbness.  All other systems reviewed and are negative.    ____________________________________________  PHYSICAL EXAM:      VITAL SIGNS: ED Triage Vitals [07/26/19 0603]  Enc Vitals Group     BP (!) 201/113     Pulse Rate (!) 102     Resp 20     Temp 98.5 F (36.9 C)     Temp Source Oral     SpO2 100 %     Weight 270 lb (122.5 kg)     Height 5\' 5"  (1.651 m)     Head Circumference      Peak Flow      Pain Score 0     Pain Loc      Pain Edu?      Excl. in [place]?      Physical Exam Vitals and nursing note reviewed.  Constitutional:      General: She is not in acute distress.    Appearance: She is well-developed.  HENT:     Head: Normocephalic and atraumatic.  Eyes:     Conjunctiva/sclera: Conjunctivae normal.  Cardiovascular:     Rate and Rhythm: Normal rate and regular rhythm.     Heart sounds: Normal heart sounds. No murmur. No friction rub.     Comments: 2+ radial and DP pulses b/l Pulmonary:     Effort: Pulmonary effort is normal. No respiratory distress.     Breath sounds: Normal breath sounds. No wheezing or rales.  Abdominal:     General: There is no distension.     Palpations: Abdomen is soft.     Tenderness: There is no abdominal tenderness.  Musculoskeletal:     Cervical back: Neck supple.  Skin:    General: Skin is warm.     Capillary Refill: Capillary refill takes less than 2 seconds.  Neurological:     Mental Status: She is alert and oriented to person, place, and time.     Motor: No abnormal muscle tone.     Comments: CN intact. Marked TTP and reported paresthesias along right median nerve distribution and left ulnar nerve distribution. Strength 5/5 throughout proximal and distal Ue and LE however. Normal quad reflexes. Sensation subjectively diminished R arm and R leg, L hand.       ____________________________________________   LABS (all labs ordered are listed, but only abnormal results are displayed)  Labs Reviewed  CBC - Abnormal;  Notable for the following components:      Result Value   RBC 5.89 (*)    Hemoglobin 11.9 (*)    MCV 65.0 (*)    MCH 20.2 (*)    RDW 16.2 (*)    All other components within normal limits  COMPREHENSIVE METABOLIC PANEL - Abnormal; Notable for the following components:   Glucose, Bld 219 (*)    Total Protein 8.3 (*)    All other components within  normal limits  TROPONIN I (HIGH SENSITIVITY)  TROPONIN I (HIGH SENSITIVITY)    ____________________________________________  EKG: Normal sinus rhythm, VR 98. PR 172, QRS 82, QTc 444. No acute ST elevations.   EKG Interpretation  Date/Time:  Thursday July 26 2019 06:07:06 EST Ventricular Rate:  98 PR Interval:  172 QRS Duration: 82 QT Interval:  348 QTC Calculation: 444 R Axis:   2 Text Interpretation: Normal sinus rhythm Possible Left atrial enlargement Borderline ECG No previous ECGs available Confirmed by UNCONFIRMED, DOCTOR ([id number]), editor Fredric Mare, Tammy [phone]) on 07/26/2019 1:10:22 PM       ________________________________________  RADIOLOGY All imaging, including plain films, CT scans, and ultrasounds, independently reviewed by me, and interpretations confirmed via formal radiology reads.  ED MD interpretation:   CT Head: NAICA CT C Spine: Significant C5-C7 foraminal stenosis CT Dissection: No dissection, no other acute abnormality  Official radiology report(s): CT Head Wo Contrast  Result Date: 07/26/2019 CLINICAL DATA:  Right-sided numbness and heaviness for a few days. EXAM: CT HEAD WITHOUT CONTRAST TECHNIQUE: Contiguous axial images were obtained from the base of the skull through the vertex without intravenous contrast. COMPARISON:  10/15/2017 FINDINGS: Brain: No evidence of acute infarction, hemorrhage, hydrocephalus, extra-axial collection or mass lesion/mass effect. Vascular: No hyperdense vessel.  Arterial calcification. Skull: Normal. Negative for fracture or focal lesion. Sinuses/Orbits: No acute finding.  IMPRESSION: No evidence of infarct or other cause of symptoms. Electronically Signed   By: Marnee Spring M.D.   On: 07/26/2019 06:50   CT Cervical Spine Wo Contrast  Result Date: 07/26/2019 CLINICAL DATA:  Cervical spinal stenosis. Right-sided numbness and heaviness. EXAM: CT CERVICAL SPINE WITHOUT CONTRAST TECHNIQUE: Multidetector CT imaging of the cervical spine was performed without intravenous contrast. Multiplanar CT image reconstructions were also generated. COMPARISON:  None. FINDINGS: Alignment: Normal alignment.  Cervical kyphosis noted. Skull base and vertebrae: Negative for fracture or mass Soft tissues and spinal canal: Negative for soft tissue mass or edema Disc levels: Mild disc bulging at C3-4 and C4-5 with mild spinal stenosis. Disc degeneration and spondylosis C5-6 with moderate spinal and foraminal stenosis bilaterally. Disc degeneration and spondylosis at C6-7 with mild spinal and foraminal stenosis bilaterally. Upper chest: Not image Other: None IMPRESSION: No acute abnormality. Cervical spondylosis with spinal and foraminal stenosis most prominent at C5-6 and C6-7. Electronically Signed   By: Marlan Palau M.D.   On: 07/26/2019 11:08   CT Angio Chest/Abd/Pel for Dissection W and/or Wo Contrast  Result Date: 07/26/2019 CLINICAL DATA:  Evaluate for acute aortic syndrome. Rule out dissection. EXAM: CT ANGIOGRAPHY CHEST, ABDOMEN AND PELVIS TECHNIQUE: Multidetector CT imaging through the chest, abdomen and pelvis was performed using the standard protocol during bolus administration of intravenous contrast. Multiplanar reconstructed images and MIPs were obtained and reviewed to evaluate the vascular anatomy. CONTRAST:  OMNIPAQUE IOHEXOL 350 MG/ML SOLN COMPARISON:  None. FINDINGS: CTA CHEST FINDINGS Cardiovascular: The heart size appears enlarged. No pericardial effusion. The thoracic aorta appears normal. No evidence of aneurysm or dissection. Lad coronary artery calcification.  Mediastinum/Nodes: Normal appearance of the thyroid gland. The trachea appears patent and is midline. Normal appearance of the esophagus. No enlarged mediastinal or hilar lymph nodes. Lungs/Pleura: No pleural effusion. No atelectasis or airspace consolidation. No suspicious pulmonary nodule or mass identified. Musculoskeletal: No chest wall abnormality. No acute or significant osseous findings. Review of the MIP images confirms the above findings. CTA ABDOMEN AND PELVIS FINDINGS VASCULAR Aorta: Normal caliber aorta without aneurysm, dissection, vasculitis or significant stenosis.  Celiac: Patent without evidence of aneurysm, dissection, vasculitis or significant stenosis. SMA: Patent without evidence of aneurysm, dissection, vasculitis or significant stenosis. Renals: Both renal arteries are patent without evidence of aneurysm, dissection, vasculitis, fibromuscular dysplasia or significant stenosis. IMA: Patent without evidence of aneurysm, dissection, vasculitis or significant stenosis. Inflow: Patent without evidence of aneurysm, dissection, vasculitis or significant stenosis. Veins: No obvious venous abnormality within the limitations of this arterial phase study. Review of the MIP images confirms the above findings. NON-VASCULAR Hepatobiliary: There is a small arterial phase enhancing structure within segment 3 measuring 0.9 cm. Within segment 2 there is a 6 mm arterial phase enhancing structure. In segment 7 a 7 mm subcapsular arterial phase enhancing structures also noted. Normal appearance of the gallbladder. No biliary dilatation. Pancreas: Unremarkable. No pancreatic ductal dilatation or surrounding inflammatory changes. Spleen: Normal in size without focal abnormality. Adrenals/Urinary Tract: Normal appearance of the adrenal glands. 2 cm cyst identified within the inferior pole of right kidney. No hydronephrosis identified bilaterally. Unremarkable appearance of the urinary bladder. Stomach/Bowel: Stomach  is within normal limits. No evidence of bowel wall thickening, distention, or inflammatory changes. Lymphatic: No significant vascular findings are present. No enlarged abdominal or pelvic lymph nodes. Reproductive: Uterus and bilateral adnexa are unremarkable. Other: No free fluid or fluid collections. Musculoskeletal: Marked degenerative disc disease is identified involving L5-S1. No acute findings noted. Review of the MIP images confirms the above findings. IMPRESSION: 1. No evidence for aortic dissection. 2. Aortic atherosclerosis. Lad coronary artery calcifications noted. 3. There are several small arterial phase enhancing structures within the liver. In the absence of a known malignancy these are favored to represent small flash filling hemangiomas. Aortic Atherosclerosis (ICD10-I70.0). Electronically Signed   By: Signa Kell M.D.   On: 07/26/2019 11:10    ____________________________________________  PROCEDURES   Procedure(s) performed (including Critical Care):  Procedures  ____________________________________________  INITIAL IMPRESSION / MDM / ASSESSMENT AND PLAN / ED COURSE  As part of my medical decision making, I reviewed the following data within the electronic MEDICAL RECORD NUMBER Nursing notes reviewed and incorporated, Old chart reviewed, Notes from prior ED visits, and [place] Controlled Substance Database       *Penny Medina was evaluated in Emergency Department on 07/26/2019 for the symptoms described in the history of present illness. She was evaluated in the context of the global COVID-19 pandemic, which necessitated consideration that the patient might be at risk for infection with the SARS-CoV-2 virus that causes COVID-19. Institutional protocols and algorithms that pertain to the evaluation of patients at risk for COVID-19 are in a state of rapid change based on information released by regulatory bodies including the CDC and federal and state organizations. These policies  and algorithms were followed during the patient's care in the ED.  Some ED evaluations and interventions may be delayed as a result of limited staffing during the pandemic.*     Medical Decision Making:  58 yo F here with RUE tingling and paresthesias, with mild paresthesias on left. Imaging, labs as above. Suspect her sx are 2/2 cervical radiculopathy made worse by recent increased physical activity. DDx includes diabetic neuropathy. Given her CP and other complaints, CT Angio obtained and is neg for dissection. No other acute abnormality noted. Will start on analgesics, refer for spine f/u. No signs of weakness or significant cord compromise currently.  ____________________________________________  FINAL CLINICAL IMPRESSION(S) / ED DIAGNOSES  Final diagnoses:  Cervical radiculopathy  Cervical stenosis of spine  Paresthesias  MEDICATIONS GIVEN DURING THIS VISIT:  Medications  HYDROmorphone (DILAUDID) injection 1 mg (1 mg Intravenous Given 07/26/19 1025)  gabapentin (NEURONTIN) capsule 100 mg (100 mg Oral Given 07/26/19 1134)  iohexol (OMNIPAQUE) 350 MG/ML injection 125 mL (125 mLs Intravenous Contrast Given 07/26/19 1037)  oxyCODONE-acetaminophen (PERCOCET/ROXICET) 5-325 MG per tablet 2 tablet (2 tablets Oral Given 07/26/19 1223)  dexamethasone (DECADRON) injection 10 mg (10 mg Intravenous Given 07/26/19 1224)  cloNIDine (CATAPRES) tablet 0.1 mg (0.1 mg Oral Given 07/26/19 1223)     ED Discharge Orders         Ordered    oxyCODONE-acetaminophen (PERCOCET) 5-325 MG tablet  Every 6 hours PRN     07/26/19 1304    naproxen (NAPROSYN) 375 MG tablet  2 times daily with meals     07/26/19 1304    gabapentin (NEURONTIN) 100 MG capsule  3 times daily     07/26/19 1304           Note:  This document was prepared using Dragon voice recognition software and may include unintentional dictation errors.   Shaune Pollack, MD 07/26/19 [phone]

## 2019-07-26 NOTE — ED Notes (Signed)
Pt to CT via w/c accomp by CT tech 

## 2019-07-26 NOTE — ED Notes (Signed)
Repeat trop collected by this RN. This RN apologized for the wait, pt c/o continued HA. Pt placed back in lobby by this RN at this time. Pt A&O x 4, NAD noted at this time.

## 2019-07-26 NOTE — ED Notes (Signed)
Pt transported to CT ?

## 2019-07-26 NOTE — ED Triage Notes (Signed)
Pt in with co right sided numbness and heaviness for a few days. States right arm that radiates down to right leg. States symptoms have not improved and now starting to feel pressure in left hand. Denies any recent illness, no hx of a stroke.

## 2019-07-26 NOTE — Discharge Instructions (Addendum)
Do not lift anything >10 lb until cleared by a spine specialists  Call your primary doctor to discuss, and to request referral to a SPINE SPECIALIST in the next 1-2 weeks  Take the medication as prescribed  You can also call Dr. Izora Ribas above to set up a follow-up

## 2019-07-28 ENCOUNTER — Emergency Department
Admission: EM | Admit: 2019-07-28 | Discharge: 2019-07-28 | Disposition: A | Discharge status: Home / Self Care | Payer: Self-pay | Attending: Emergency Medicine | Admitting: Emergency Medicine

## 2019-07-28 ENCOUNTER — Telehealth: Payer: Self-pay | Admitting: Emergency Medicine

## 2019-07-28 ENCOUNTER — Other Ambulatory Visit: Payer: Self-pay

## 2019-07-28 DIAGNOSIS — Z7982 Long term (current) use of aspirin: Secondary | ICD-10-CM | POA: Insufficient documentation

## 2019-07-28 DIAGNOSIS — I10 Essential (primary) hypertension: Secondary | ICD-10-CM | POA: Insufficient documentation

## 2019-07-28 DIAGNOSIS — M5412 Radiculopathy, cervical region: Secondary | ICD-10-CM | POA: Insufficient documentation

## 2019-07-28 DIAGNOSIS — Z7984 Long term (current) use of oral hypoglycemic drugs: Secondary | ICD-10-CM | POA: Insufficient documentation

## 2019-07-28 DIAGNOSIS — E119 Type 2 diabetes mellitus without complications: Secondary | ICD-10-CM | POA: Insufficient documentation

## 2019-07-28 DIAGNOSIS — Z87891 Personal history of nicotine dependence: Secondary | ICD-10-CM | POA: Insufficient documentation

## 2019-07-28 DIAGNOSIS — Z760 Encounter for issue of repeat prescription: Secondary | ICD-10-CM | POA: Insufficient documentation

## 2019-07-28 DIAGNOSIS — Z79899 Other long term (current) drug therapy: Secondary | ICD-10-CM | POA: Insufficient documentation

## 2019-07-28 MED ORDER — NAPROXEN 375 MG PO TABS: 375.0000 mg | ORAL_TABLET | Freq: Two times a day (BID) | ORAL | 0 refills | Status: AC

## 2019-07-28 MED ORDER — OXYCODONE-ACETAMINOPHEN 5-325 MG PO TABS: 1.0000 | ORAL_TABLET | Freq: Four times a day (QID) | ORAL | 0 refills | Status: DC | PRN

## 2019-07-28 NOTE — ED Provider Notes (Signed)
I was asked to reorder patient's medications as her prescriptions were not signed at time of discharge.  I am unable to do so at this time as it has been greater than 2 hours since her ER visit.  I have sent a message to our inbox system to our ER nurse navigator for appropriate follow-up and prescription routing as I cannot prescribe at this time   Delman Kitten, MD 07/28/19 1206

## 2019-07-28 NOTE — Telephone Encounter (Signed)
I was asked to call in prescriptions for patient as she had been given a paper Rx without signature. She is now checked in to receive a copy in the ER, so electronic prescriptions were not sent. Please see further ED notes for additional details.

## 2019-07-28 NOTE — ED Triage Notes (Signed)
Patient originally arrives with paper prescription which she received here and pharmacy was unable to fill due to lack of signature by MD. Current provider unable to replace prescription due to being 63 days old. Patient has paper copy with her. Being signed in for eval for RX.

## 2019-07-28 NOTE — ED Provider Notes (Signed)
[facility] Emergency Department Provider Note   ____________________________________________   First MD Initiated Contact with Patient 07/28/19 1319     (approximate)  I have reviewed the triage vital signs and the nursing notes.   HISTORY  Chief Complaint Neck Pain    HPI Penny Medina is a 58 y.o. female patient presents to the ED for follow-up visit 2 days ago.  Patient was seen at this facility and diagnosed with cervical radiculopathy and given a prescription for naproxen and Percocets.  Prescription was handwritten with last signed by the treating doctor.  Patient did not realize it was not signed so she took her to the pharmacist.  Patient is here today to had a prescription renewed with signature.  Patient stated no change in her previous complaint.         Past Medical History:  Diagnosis Date  . Diabetes mellitus without complication ([facility])   . Heart attack ([facility])   . Hypertension     There are no problems to display for this patient.   Past Surgical History:  Procedure Laterality Date  . CESAREAN SECTION    . TUBAL LIGATION      Prior to Admission medications   Medication Sig Start Date End Date Taking? Authorizing Provider  amLODipine (NORVASC) 10 MG tablet Take 10 mg by mouth daily.    [provider]  aspirin 81 MG chewable tablet Chew 81 mg by mouth daily.    [provider]  carvedilol (COREG) 12.5 MG tablet Take 12.5 mg by mouth 2 (two) times daily with a meal.    [provider]  gabapentin (NEURONTIN) 100 MG capsule Take 1 capsule (100 mg total) by mouth 3 (three) times daily for 10 days. 07/26/19 08/05/19  Shaune Pollack, MD  glimepiride (AMARYL) 2 MG tablet Take 2 mg by mouth daily with breakfast.    [provider]  metFORMIN (GLUCOPHAGE) 500 MG tablet Take 500 mg by mouth 2 (two) times daily with a meal.    [provider]  naproxen (NAPROSYN) 375 MG tablet Take 1 tablet  (375 mg total) by mouth 2 (two) times daily with a meal for 5 days. 07/28/19 08/02/19  Joni Reining, PA-C  oxyCODONE-acetaminophen (PERCOCET) 5-325 MG tablet Take 1-2 tablets by mouth every 6 (six) hours as needed for moderate pain or severe pain. 07/28/19 07/27/20  Joni Reining, PA-C    Allergies Penicillins and Sulfa antibiotics  No family history on file.  Social History Social History   Tobacco Use  . Smoking status: Former Smoker  Substance Use Topics  . Alcohol use: No  . Drug use: Not on file    Review of Systems  Constitutional: No fever/chills Eyes: No visual changes. ENT: No sore throat. Cardiovascular: Denies chest pain. Respiratory: Denies shortness of breath. Gastrointestinal: No abdominal pain.  No nausea, no vomiting.  No diarrhea.  No constipation. Genitourinary: Negative for dysuria. Musculoskeletal: Negative for back pain. Skin: Negative for rash. Neurological: Negative for headaches, focal weakness or numbness. Psychiatric:  Endocrine:  Diabetes and hypertension. Allergic/Immunilogical: Penicillin and sulfa. ____________________________________________   PHYSICAL EXAM:  VITAL SIGNS: ED Triage Vitals [07/28/19 1245]  Enc Vitals Group     BP (!) 165/79     Pulse Rate 65     Resp 20     Temp 98.6 F (37 C)     Temp Source Oral     SpO2 100 %     Weight 270 lb (122.5  kg)     Height 5\' 5"  (1.651 m)     Head Circumference      Peak Flow      Pain Score 9     Pain Loc      Pain Edu?      Excl. in [place]?     Constitutional: Alert and oriented. Well appearing and in no acute distress. Eyes: Conjunctivae are normal. PERRL. EOMI. Head: Atraumatic. Nose: No congestion/rhinnorhea. Mouth/Throat: Mucous membranes are moist.  Oropharynx non-erythematous. Neck: No stridor.  Neck is supple.  Moderate guarding palpation cervical spine C5 and six.   Cardiovascular: Normal rate, regular rhythm. Grossly normal heart sounds.  Good peripheral  circulation.  Elevated blood pressure. Respiratory: Normal respiratory effort.  No retractions. Lungs CTAB. Gastrointestinal: Soft and nontender. No distention. No abdominal bruits. No CVA tenderness. Musculoskeletal: No lower extremity tenderness nor edema.  No joint effusions. Neurologic:  Normal speech and language. No gross focal neurologic deficits are appreciated. No gait instability. Skin:  Skin is warm, dry and intact. No rash noted. Psychiatric: Mood and affect are normal. Speech and behavior are normal.  ____________________________________________   LABS (all labs ordered are listed, but only abnormal results are displayed)  Labs Reviewed - No data to display ____________________________________________  EKG   ____________________________________________  RADIOLOGY  ED MD interpretation:    Official radiology report(s): No results found.  ____________________________________________   PROCEDURES  Procedure(s) performed (including Critical Care):  Procedures   ____________________________________________   INITIAL IMPRESSION / ASSESSMENT AND PLAN / ED COURSE  As part of my medical decision making, I reviewed the following data within the [place]      Patient presents for renewal of prescription that was handwritten 5-70 on a previous visit 2 days ago.  No change in physical exam from previous visit.  Prescription was renewed and signed.  [facility]   Penny Medina was evaluated in Emergency Department on 07/28/2019 for the symptoms described in the history of present illness. She was evaluated in the context of the global COVID-19 pandemic, which necessitated consideration that the patient might be at risk for infection with the SARS-CoV-2 virus that causes COVID-19. Institutional protocols and algorithms that pertain to the evaluation of patients at risk for COVID-19 are in a state of  rapid change based on information released by regulatory bodies including the CDC and federal and state organizations. These policies and algorithms were followed during the patient's care in the ED.       ____________________________________________   FINAL CLINICAL IMPRESSION(S) / ED DIAGNOSES  Final diagnoses:  Encounter for medication refill  Cervical radiculopathy     ED Discharge Orders         Ordered    naproxen (NAPROSYN) 375 MG tablet  2 times daily with meals     07/28/19 1323    oxyCODONE-acetaminophen (PERCOCET) 5-325 MG tablet  Every 6 hours PRN     07/28/19 1323           Note:  This document was prepared using Dragon voice recognition software and may include unintentional dictation errors.    Sable Feil, PA-C 07/28/19 1330    Blake Divine, MD 07/28/19 1525

## 2019-07-28 NOTE — Telephone Encounter (Deleted)
Notified that printed prescriptions were not signed prior to being given to patient. Encounter made to electronically prescribe medications. No new encounter or charge.

## 2019-08-23 ENCOUNTER — Other Ambulatory Visit: Payer: Self-pay | Admitting: Student

## 2019-08-23 DIAGNOSIS — M79642 Pain in left hand: Secondary | ICD-10-CM

## 2019-08-23 DIAGNOSIS — M79641 Pain in right hand: Secondary | ICD-10-CM

## 2019-09-05 ENCOUNTER — Ambulatory Visit
Admission: RE | Admit: 2019-09-05 | Discharge: 2019-09-05 | Disposition: A | Discharge status: Home / Self Care | Payer: Self-pay | Source: Ambulatory Visit | Attending: Student | Admitting: Student

## 2019-09-05 ENCOUNTER — Other Ambulatory Visit: Payer: Self-pay

## 2019-09-05 DIAGNOSIS — M79642 Pain in left hand: Secondary | ICD-10-CM | POA: Insufficient documentation

## 2019-09-05 DIAGNOSIS — M79641 Pain in right hand: Secondary | ICD-10-CM | POA: Insufficient documentation

## 2020-05-28 ENCOUNTER — Inpatient Hospital Stay
Admission: EM | Admit: 2020-05-28 | Discharge: 2020-05-30 | DRG: 068 | Disposition: A | Discharge status: Home / Self Care | Payer: Self-pay | Attending: Internal Medicine | Admitting: Internal Medicine

## 2020-05-28 ENCOUNTER — Observation Stay: Payer: Self-pay

## 2020-05-28 ENCOUNTER — Emergency Department: Payer: Self-pay

## 2020-05-28 ENCOUNTER — Encounter: Payer: Self-pay | Admitting: *Deleted

## 2020-05-28 ENCOUNTER — Other Ambulatory Visit: Payer: Self-pay

## 2020-05-28 DIAGNOSIS — Z882 Allergy status to sulfonamides status: Secondary | ICD-10-CM

## 2020-05-28 DIAGNOSIS — I700 Atherosclerosis of aorta: Secondary | ICD-10-CM | POA: Diagnosis present

## 2020-05-28 DIAGNOSIS — I10 Essential (primary) hypertension: Secondary | ICD-10-CM | POA: Diagnosis present

## 2020-05-28 DIAGNOSIS — I2510 Atherosclerotic heart disease of native coronary artery without angina pectoris: Secondary | ICD-10-CM | POA: Diagnosis present

## 2020-05-28 DIAGNOSIS — Z20822 Contact with and (suspected) exposure to covid-19: Secondary | ICD-10-CM | POA: Diagnosis present

## 2020-05-28 DIAGNOSIS — R2990 Unspecified symptoms and signs involving the nervous system: Secondary | ICD-10-CM | POA: Diagnosis present

## 2020-05-28 DIAGNOSIS — I252 Old myocardial infarction: Secondary | ICD-10-CM

## 2020-05-28 DIAGNOSIS — Z7982 Long term (current) use of aspirin: Secondary | ICD-10-CM

## 2020-05-28 DIAGNOSIS — G8929 Other chronic pain: Secondary | ICD-10-CM | POA: Diagnosis present

## 2020-05-28 DIAGNOSIS — Z880 Allergy status to penicillin: Secondary | ICD-10-CM

## 2020-05-28 DIAGNOSIS — E66813 Obesity, class 3: Secondary | ICD-10-CM | POA: Diagnosis present

## 2020-05-28 DIAGNOSIS — Z823 Family history of stroke: Secondary | ICD-10-CM

## 2020-05-28 DIAGNOSIS — R000 Tachycardia, unspecified: Secondary | ICD-10-CM | POA: Diagnosis not present

## 2020-05-28 DIAGNOSIS — Z79899 Other long term (current) drug therapy: Secondary | ICD-10-CM

## 2020-05-28 DIAGNOSIS — Z7984 Long term (current) use of oral hypoglycemic drugs: Secondary | ICD-10-CM

## 2020-05-28 DIAGNOSIS — R519 Headache, unspecified: Secondary | ICD-10-CM

## 2020-05-28 DIAGNOSIS — I639 Cerebral infarction, unspecified: Secondary | ICD-10-CM

## 2020-05-28 DIAGNOSIS — G8314 Monoplegia of lower limb affecting left nondominant side: Secondary | ICD-10-CM | POA: Diagnosis present

## 2020-05-28 DIAGNOSIS — I6522 Occlusion and stenosis of left carotid artery: Principal | ICD-10-CM | POA: Diagnosis present

## 2020-05-28 DIAGNOSIS — Z6841 Body Mass Index (BMI) 40.0 and over, adult: Secondary | ICD-10-CM

## 2020-05-28 DIAGNOSIS — Z87891 Personal history of nicotine dependence: Secondary | ICD-10-CM

## 2020-05-28 DIAGNOSIS — R29898 Other symptoms and signs involving the musculoskeletal system: Secondary | ICD-10-CM

## 2020-05-28 DIAGNOSIS — M5412 Radiculopathy, cervical region: Secondary | ICD-10-CM | POA: Diagnosis present

## 2020-05-28 DIAGNOSIS — E119 Type 2 diabetes mellitus without complications: Secondary | ICD-10-CM

## 2020-05-28 LAB — COMPREHENSIVE METABOLIC PANEL
ALT: 14 U/L (ref 0–44)
AST: 18 U/L (ref 15–41)
Albumin: 4.2 g/dL (ref 3.5–5.0)
Alkaline Phosphatase: 72 U/L (ref 38–126)
Anion gap: 11 (ref 5–15)
BUN: 6 mg/dL (ref 6–20)
CO2: 27 mmol/L (ref 22–32)
Calcium: 9.8 mg/dL (ref 8.9–10.3)
Chloride: 100 mmol/L (ref 98–111)
Creatinine, Ser: 0.86 mg/dL (ref 0.44–1.00)
GFR, Estimated: >60 mL/min (ref >60)
Glucose, Bld: 215 mg/dL — ABNORMAL HIGH (ref 70–99)
Potassium: 3.8 mmol/L (ref 3.5–5.1)
Sodium: 138 mmol/L (ref 135–145)
Total Bilirubin: 0.6 mg/dL (ref 0.3–1.2)
Total Protein: 8.5 g/dL — ABNORMAL HIGH (ref 6.5–8.1)

## 2020-05-28 LAB — DIFFERENTIAL
Abs Immature Granulocytes: 0.02 10*3/uL (ref 0.00–0.07)
Basophils Absolute: 0.1 10*3/uL (ref 0.0–0.1)
Basophils Relative: 1 %
Eosinophils Absolute: 0.4 10*3/uL (ref 0.0–0.5)
Eosinophils Relative: 7 %
Immature Granulocytes: 0 %
Lymphocytes Relative: 40 %
Lymphs Abs: 2.5 10*3/uL (ref 0.7–4.0)
Monocytes Absolute: 0.5 10*3/uL (ref 0.1–1.0)
Monocytes Relative: 7 %
Neutro Abs: 2.8 10*3/uL (ref 1.7–7.7)
Neutrophils Relative %: 45 %

## 2020-05-28 LAB — CBC
HCT: 42.5 % (ref 36.0–46.0)
Hemoglobin: 12.7 g/dL (ref 12.0–15.0)
MCH: 20 pg — ABNORMAL LOW (ref 26.0–34.0)
MCHC: 29.9 g/dL — ABNORMAL LOW (ref 30.0–36.0)
MCV: 66.8 fL — ABNORMAL LOW (ref 80.0–100.0)
Platelets: 196 10*3/uL (ref 150–400)
RBC: 6.36 MIL/uL — ABNORMAL HIGH (ref 3.87–5.11)
RDW: 17.1 % — ABNORMAL HIGH (ref 11.5–15.5)
WBC: 6.3 10*3/uL (ref 4.0–10.5)
nRBC: 0 % (ref 0.0–0.2)

## 2020-05-28 LAB — GLUCOSE, CAPILLARY
Glucose-Capillary: 198 mg/dL — ABNORMAL HIGH (ref 70–99)
Glucose-Capillary: 197 mg/dL — ABNORMAL HIGH (ref 70–99)

## 2020-05-28 LAB — APTT: aPTT: 27 seconds (ref 24–36)

## 2020-05-28 LAB — PROTIME-INR
INR: 1 (ref 0.8–1.2)
Prothrombin Time: 12.8 seconds (ref 11.4–15.2)

## 2020-05-28 MED ORDER — SODIUM CHLORIDE 0.9% FLUSH
3.0000 mL | Freq: Once | INTRAVENOUS | Status: AC
  Administered 2020-05-28: 3 mL via INTRAVENOUS

## 2020-05-28 MED ORDER — ONDANSETRON HCL 4 MG/2ML IJ SOLN
4.0000 mg | Freq: Once | INTRAMUSCULAR | Status: AC
  Administered 2020-05-28: 4 mg via INTRAVENOUS
  Filled 2020-05-28: qty 2

## 2020-05-28 MED ORDER — MORPHINE SULFATE (PF) 4 MG/ML IV SOLN
6.0000 mg | Freq: Once | INTRAVENOUS | Status: AC
  Administered 2020-05-28: 6 mg via INTRAVENOUS
  Filled 2020-05-28: qty 2

## 2020-05-28 MED ORDER — IOHEXOL 350 MG/ML SOLN
100.0000 mL | Freq: Once | INTRAVENOUS | Status: AC | PRN
  Administered 2020-05-28: 100 mL via INTRAVENOUS

## 2020-05-28 MED ORDER — ASPIRIN 81 MG PO CHEW
324.0000 mg | CHEWABLE_TABLET | Freq: Once | ORAL | Status: AC
  Administered 2020-05-28: 324 mg via ORAL
  Filled 2020-05-28: qty 4

## 2020-05-28 MED ORDER — INSULIN ASPART 100 UNIT/ML ~~LOC~~ SOLN
0.0000 [IU] | SUBCUTANEOUS | Status: DC
  Administered 2020-05-28: 2 [IU] via SUBCUTANEOUS
  Administered 2020-05-29: 3 [IU] via SUBCUTANEOUS
  Administered 2020-05-29: 2 [IU] via SUBCUTANEOUS
  Administered 2020-05-29 (×3): 3 [IU] via SUBCUTANEOUS
  Administered 2020-05-30: 2 [IU] via SUBCUTANEOUS
  Administered 2020-05-30: 3 [IU] via SUBCUTANEOUS
  Administered 2020-05-30 (×2): 2 [IU] via SUBCUTANEOUS
  Administered 2020-05-30: 3 [IU] via SUBCUTANEOUS
  Filled 2020-05-28 (×10): qty 1

## 2020-05-28 NOTE — ED Notes (Signed)
Bedside swallow screen completed. Pt tolerated well.

## 2020-05-28 NOTE — ED Notes (Signed)
Pt presents to Ed with c/o of being woke up this morning with a pounding headache, pt denies HX of migraines, pt states she took home meds such as gabapentin and robaxin to relieve pain. Pt states this did not help. Pt states this afternoon she noticed she was having difficulty with L foot and being able to ambulate and L sided weakness. Pt is currently A&Ox4. BP high, MD at bedside.

## 2020-05-28 NOTE — Consult Note (Signed)
Requesting Physician: Fanny Bien    Chief Complaint: Left sided weakness  I have been asked by Dr. Fanny Bien to see this patient in consultation for code stroke.  HPI: Penny Medina is an 59 y.o. female with a history of CAD, HTN and DM who reports having a mild headache on yesterday.  Went to bed at about 2200.  Awakened between 3 and 4 this morning and noted severe left sided headache (above the left eye) with left sided heaviness.  Symptoms did not improve and patient presented for evaluation.  Initial NIHSS of 3.    Date last known well: Date: 05/27/2020 Time last known well: Time: 22:00 tPA Given: No: Outside time window  Past Medical History:  Diagnosis Date  . Diabetes mellitus without complication (HCC)   . Heart attack (HCC)   . Hypertension     Past Surgical History:  Procedure Laterality Date  . CESAREAN SECTION    . TUBAL LIGATION      Family history: Mother with CAD  Social History:  reports that she has quit smoking. She has never used smokeless tobacco. She reports that she does not drink alcohol and does not use drugs.  Allergies:  Allergies  Allergen Reactions  . Penicillins   . Sulfa Antibiotics Rash    Medications: I have reviewed the patient's current medications. Prior to Admission medications   Medication Sig Start Date End Date Taking? Authorizing Provider  amLODipine (NORVASC) 10 MG tablet Take 10 mg by mouth daily.    [provider]  aspirin 81 MG chewable tablet Chew 81 mg by mouth daily.    [provider]  carvedilol (COREG) 12.5 MG tablet Take 12.5 mg by mouth 2 (two) times daily with a meal.    [provider]  gabapentin (NEURONTIN) 100 MG capsule Take 1 capsule (100 mg total) by mouth 3 (three) times daily for 10 days. 07/26/19 08/05/19  Shaune Pollack, MD  glimepiride (AMARYL) 2 MG tablet Take 2 mg by mouth daily with breakfast.    [provider]  metFORMIN (GLUCOPHAGE) 500 MG tablet Take 500 mg by mouth 2  (two) times daily with a meal.    [provider]  oxyCODONE-acetaminophen (PERCOCET) 5-325 MG tablet Take 1-2 tablets by mouth every 6 (six) hours as needed for moderate pain or severe pain. 07/28/19 07/27/20  Joni Reining, PA-C    ROS: History obtained from the patient  General ROS: negative for - chills, fatigue, fever, night sweats, weight gain or weight loss Psychological ROS: negative for - behavioral disorder, hallucinations, memory difficulties, mood swings or suicidal ideation Ophthalmic ROS: negative for - blurry vision, double vision, eye pain or loss of vision ENT ROS: negative for - epistaxis, nasal discharge, oral lesions, sore throat, tinnitus or vertigo Allergy and Immunology ROS: negative for - hives or itchy/watery eyes Hematological and Lymphatic ROS: negative for - bleeding problems, bruising or swollen lymph nodes Endocrine ROS: negative for - galactorrhea, hair pattern changes, polydipsia/polyuria or temperature intolerance Respiratory ROS: negative for - cough, hemoptysis, shortness of breath or wheezing Cardiovascular ROS: negative for - chest pain, dyspnea on exertion, edema or irregular heartbeat Gastrointestinal ROS: negative for - abdominal pain, diarrhea, hematemesis, nausea/vomiting or stool incontinence Genito-Urinary ROS: negative for - dysuria, hematuria, incontinence or urinary frequency/urgency Musculoskeletal ROS: back pain, right sided weakness and pain Neurological ROS: as noted in HPI Dermatological ROS: negative for rash and skin lesion changes  Physical Examination: Blood pressure (!) 176/99, pulse 86, temperature 98.6 F (  37 C), temperature source Oral, resp. rate 18, height 5\' 5"  (1.651 m), weight 118.2 kg, SpO2 98 %.  HEENT-  Normocephalic, no lesions, without obvious abnormality.  Normal external eye and conjunctiva.  Normal TM's bilaterally.  Normal auditory canals and external ears. Normal external nose, mucus membranes and septum.   Normal pharynx. Cardiovascular- S1, S2 normal, pulses palpable throughout   Lungs- chest clear, no wheezing, rales, normal symmetric air entry Abdomen- soft, non-tender; bowel sounds normal; no masses,  no organomegaly Extremities- no edema Lymph-no adenopathy palpable Musculoskeletal-BLE tenderness to palpation Skin-warm and dry, no hyperpigmentation, vitiligo, or suspicious lesions  Neurological Examination   Mental Status: Alert, oriented, thought content appropriate.  Speech fluent with some word finding difficulties at times.  Able to follow 3 step commands without difficulty. Cranial Nerves: II: Visual fields grossly normal, pupils equal, round, reactive to light and accommodation III,IV, VI: ptosis not present, extra-ocular motions intact bilaterally V,VII: smile symmetric, facial light touch sensation normal bilaterally VIII: hearing normal bilaterally IX,X: gag reflex present XI: bilateral shoulder shrug XII: midline tongue extension Motor: Right : Upper extremity   5/5    Left:     Upper extremity   5/5  Lower extremity   4+/5     Lower extremity   4+/5 Tone and bulk:normal tone throughout; no atrophy noted Sensory: Pinprick and light touch intact throughout, bilaterally Deep Tendon Reflexes: Symmetric throughout Plantars: Right: mute   Left: mute Cerebellar: Normal finger-to-nose and normal heel-to-shin testing bilaterally Gait: not tested due to safety concerns    Laboratory Studies:  Basic Metabolic Panel: Recent Labs  Lab 05/28/20 1730  NA 138  K 3.8  CL 100  CO2 27  GLUCOSE 215*  BUN 6  CREATININE 0.86  CALCIUM 9.8    Liver Function Tests: Recent Labs  Lab 05/28/20 1730  AST 18  ALT 14  ALKPHOS 72  BILITOT 0.6  PROT 8.5*  ALBUMIN 4.2   No results for input(s): LIPASE, AMYLASE in the last 168 hours. No results for input(s): AMMONIA in the last 168 hours.  CBC: Recent Labs  Lab 05/28/20 1730  WBC 6.3  NEUTROABS 2.8  HGB 12.7  HCT 42.5   MCV 66.8*  PLT 196    Cardiac Enzymes: No results for input(s): CKTOTAL, CKMB, CKMBINDEX, TROPONINI in the last 168 hours.  BNP: Invalid input(s): POCBNP  CBG: Recent Labs  Lab 05/28/20 1813  GLUCAP 198*    Microbiology: Results for orders placed or performed during the hospital encounter of 06/20/18  Wet prep, genital     Status: Abnormal   Collection Time: 06/20/18  5:18 PM  Result Value Ref Range Status   Yeast Wet Prep HPF POC NONE SEEN NONE SEEN Final   Trich, Wet Prep PRESENT (A) NONE SEEN Final   Clue Cells Wet Prep HPF POC NONE SEEN NONE SEEN Final   WBC, Wet Prep HPF POC MANY (A) NONE SEEN Final   Sperm NONE SEEN  Final    Comment: Performed at [facility], [address] [place], [place] [id number]  Chlamydia/NGC rt PCR [facility] only)     Status: None   Collection Time: 06/20/18  5:18 PM  Result Value Ref Range Status   Specimen source GC/Chlam ENDOCERVICAL  Final   Chlamydia Tr NOT DETECTED NOT DETECTED Final   N gonorrhoeae NOT DETECTED NOT DETECTED Final    Comment: (NOTE) This CT/NG assay has not been evaluated in patients with a history of  hysterectomy. Performed  at [facility], [address] [place], [place] [address]     Coagulation Studies: Recent Labs    05/28/20 1730  LABPROT 12.8  INR 1.0    Urinalysis: No results for input(s): COLORURINE, LABSPEC, PHURINE, GLUCOSEU, HGBUR, BILIRUBINUR, KETONESUR, PROTEINUR, UROBILINOGEN, NITRITE, LEUKOCYTESUR in the last 168 hours.  Invalid input(s): APPERANCEUR  Lipid Panel: No results found for: CHOL, TRIG, HDL, CHOLHDL, VLDL, LDLCALC  HgbA1C: No results found for: HGBA1C  Urine Drug Screen:  No results found for: LABOPIA, COCAINSCRNUR, LABBENZ, AMPHETMU, THCU, LABBARB  Alcohol Level: No results for input(s): ETH in the last 168 hours.  Other results: EKG: sinus rhythm at 92 bpm.  Imaging: CT Angio Head W or Wo Contrast  Result Date: 05/28/2020 CLINICAL DATA:  Code  stroke. 59 year old female with sudden onset left side weakness and heaviness. EXAM: CT HEAD WITHOUT CONTRAST CT ANGIOGRAPHY HEAD AND NECK CT PERFUSION BRAIN TECHNIQUE: Multidetector CT imaging of the head, and CTA head and neck was performed using the standard protocol during bolus administration of intravenous contrast. Multiplanar CT image reconstructions and MIPs were obtained to evaluate the vascular anatomy. Carotid stenosis measurements (when applicable) are obtained utilizing NASCET criteria, using the distal internal carotid diameter as the denominator. Multiphase CT imaging of the brain was performed following IV bolus contrast injection. Subsequent parametric perfusion maps were calculated using RAPID software. CONTRAST:  OMNIPAQUE IOHEXOL 350 MG/ML SOLN COMPARISON:  Head CT 05/26/2019. FINDINGS: CT HEAD FINDINGS Brain: Cerebral volume is stable and within normal limits for age. No midline shift, ventriculomegaly, mass effect, evidence of mass lesion, intracranial hemorrhage or evidence of cortically based acute infarction. Gray-white matter differentiation is within normal limits throughout the brain. Chronic partially empty sella. No cortical encephalomalacia identified. Vascular: Calcified atherosclerosis at the skull base. No suspicious intracranial vascular hyperdensity. Skull: No acute osseous abnormality identified. Sinuses/Orbits: Visualized paranasal sinuses and mastoids are stable and well pneumatized. Other: Visualized orbits and scalp soft tissues are within normal limits. ASPECTS [facility] Stroke Program Early CT Score) Total score (0-10 with 10 being normal): 10 Review of the MIP images confirms the above findings CT Brain Perfusion Findings: ASPECTS: 10 CBF (<30%) Volume: 23mL Perfusion (Tmax>6.0s) volume: 68mL (anterior inferior left temporal lobe, likely artifact) Mismatch Volume: 10mL Infarction Location:Not applicable CTA NECK Skeleton: Absent dentition. Cervical disc and endplate  degeneration. No acute osseous abnormality identified. Upper chest: Negative. Other neck: Negative. Aortic arch: 3 vessel arch configuration with mild great vessel origin atherosclerosis. Right carotid system: Soft and calcified plaque in the proximal brachiocephalic artery but no significant stenosis. Negative right CCA origin. Mildly tortuous right CCA. Calcified plaque at the right ICA origin without stenosis. Tortuous vessel distal to the bulb with a kinked appearance (series 9, image 22). Left carotid system: Mild plaque at the left CCA origin without stenosis. Tortuous left CCA. No significant plaque at the left carotid bifurcation or proximal ICA. Tortuous left ICA distal to the bulb similar to the right side with a kinked appearance. Vertebral arteries: Normal proximal right subclavian artery and right vertebral artery origin. The right vertebral artery is patent and within normal limits to the skull base. Mild plaque in the proximal left subclavian artery without stenosis. Normal left vertebral artery origin. Tortuous left V1 segment. The left vertebral is patent and within normal limits to the skull base. CTA HEAD Posterior circulation: The distal right vertebral artery is mildly dominant especially beyond the left PICA origin. No distal vertebral stenosis. Patent vertebrobasilar junction and basilar artery  without stenosis. The right AICA appears dominant. Normal SCA and PCA origins. Posterior communicating arteries are diminutive or absent. Bilateral PCA branches are within normal limits. Anterior circulation: Both ICA siphons are patent. Moderate calcified plaque of the left siphon cavernous segment with associated moderate to severe stenosis in the distal cavernous segment (radiographic string sign series 6, image 93 and series 7, image 105 just proximal to the anterior genu. Supraclinoid left ICA remains patent and within normal limits. Mild to moderate right cavernous ICA calcified plaque and mild to  moderate stenosis (series 5, image 109). Patent and negative supraclinoid right ICA. Patent carotid termini, MCA and ACA origins. Anterior communicating artery is diminutive or absent. Bilateral ACA branches are within normal limits. Left MCA M1 segment and bifurcation are patent without stenosis. Right MCA M1 segment and bifurcation are patent without stenosis. Bilateral MCA branches are within normal limits. Venous sinuses: Patent. Anatomic variants: Mildly dominant right vertebral artery. Review of the MIP images confirms the above findings IMPRESSION: 1. Negative for large vessel occlusion. No core infarct detected by CT Perfusion. Stable and negative plain CT appearance of the brain. 2. 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 There is contralateral mild to moderate right ICA siphon plaque and stenosis. 3. But there is very little extracranial atherosclerosis, with no significant vertebral artery or cervical carotid stenosis. Electronically Signed   By: Odessa Fleming M.D.   On: 05/28/2020 18:32   CT Angio Neck W and/or Wo Contrast  Result Date: 05/28/2020 CLINICAL DATA:  Code stroke. 59 year old female with sudden onset left side weakness and heaviness. EXAM: CT HEAD WITHOUT CONTRAST CT ANGIOGRAPHY HEAD AND NECK CT PERFUSION BRAIN TECHNIQUE: Multidetector CT imaging of the head, and CTA head and neck was performed using the standard protocol during bolus administration of intravenous contrast. Multiplanar CT image reconstructions and MIPs were obtained to evaluate the vascular anatomy. Carotid stenosis measurements (when applicable) are obtained utilizing NASCET criteria, using the distal internal carotid diameter as the denominator. Multiphase CT imaging of the brain was performed following IV bolus contrast injection. Subsequent parametric perfusion maps were calculated using RAPID software. CONTRAST:  OMNIPAQUE IOHEXOL 350 MG/ML SOLN  COMPARISON:  Head CT 05/26/2019. FINDINGS: CT HEAD FINDINGS Brain: Cerebral volume is stable and within normal limits for age. No midline shift, ventriculomegaly, mass effect, evidence of mass lesion, intracranial hemorrhage or evidence of cortically based acute infarction. Gray-white matter differentiation is within normal limits throughout the brain. Chronic partially empty sella. No cortical encephalomalacia identified. Vascular: Calcified atherosclerosis at the skull base. No suspicious intracranial vascular hyperdensity. Skull: No acute osseous abnormality identified. Sinuses/Orbits: Visualized paranasal sinuses and mastoids are stable and well pneumatized. Other: Visualized orbits and scalp soft tissues are within normal limits. ASPECTS [facility] Stroke Program Early CT Score) Total score (0-10 with 10 being normal): 10 Review of the MIP images confirms the above findings CT Brain Perfusion Findings: ASPECTS: 10 CBF (<30%) Volume: 56mL Perfusion (Tmax>6.0s) volume: 74mL (anterior inferior left temporal lobe, likely artifact) Mismatch Volume: 61mL Infarction Location:Not applicable CTA NECK Skeleton: Absent dentition. Cervical disc and endplate degeneration. No acute osseous abnormality identified. Upper chest: Negative. Other neck: Negative. Aortic arch: 3 vessel arch configuration with mild great vessel origin atherosclerosis. Right carotid system: Soft and calcified plaque in the proximal brachiocephalic artery but no significant stenosis. Negative right CCA origin. Mildly tortuous right CCA. Calcified plaque at the right ICA origin without stenosis. Tortuous vessel distal to  the bulb with a kinked appearance (series 9, image 22). Left carotid system: Mild plaque at the left CCA origin without stenosis. Tortuous left CCA. No significant plaque at the left carotid bifurcation or proximal ICA. Tortuous left ICA distal to the bulb similar to the right side with a kinked appearance. Vertebral arteries: Normal  proximal right subclavian artery and right vertebral artery origin. The right vertebral artery is patent and within normal limits to the skull base. Mild plaque in the proximal left subclavian artery without stenosis. Normal left vertebral artery origin. Tortuous left V1 segment. The left vertebral is patent and within normal limits to the skull base. CTA HEAD Posterior circulation: The distal right vertebral artery is mildly dominant especially beyond the left PICA origin. No distal vertebral stenosis. Patent vertebrobasilar junction and basilar artery without stenosis. The right AICA appears dominant. Normal SCA and PCA origins. Posterior communicating arteries are diminutive or absent. Bilateral PCA branches are within normal limits. Anterior circulation: Both ICA siphons are patent. Moderate calcified plaque of the left siphon cavernous segment with associated moderate to severe stenosis in the distal cavernous segment (radiographic string sign series 6, image 93 and series 7, image 105 just proximal to the anterior genu. Supraclinoid left ICA remains patent and within normal limits. Mild to moderate right cavernous ICA calcified plaque and mild to moderate stenosis (series 5, image 109). Patent and negative supraclinoid right ICA. Patent carotid termini, MCA and ACA origins. Anterior communicating artery is diminutive or absent. Bilateral ACA branches are within normal limits. Left MCA M1 segment and bifurcation are patent without stenosis. Right MCA M1 segment and bifurcation are patent without stenosis. Bilateral MCA branches are within normal limits. Venous sinuses: Patent. Anatomic variants: Mildly dominant right vertebral artery. Review of the MIP images confirms the above findings IMPRESSION: 1. Negative for large vessel occlusion. No core infarct detected by CT Perfusion. Stable and negative plain CT appearance of the brain. 2. 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 There is contralateral mild to moderate right ICA siphon plaque and stenosis. 3. But there is very little extracranial atherosclerosis, with no significant vertebral artery or cervical carotid stenosis. Electronically Signed   By: Odessa Fleming M.D.   On: 05/28/2020 18:32   CT CEREBRAL PERFUSION W CONTRAST  Result Date: 05/28/2020 CLINICAL DATA:  Code stroke. 59 year old female with sudden onset left side weakness and heaviness. EXAM: CT HEAD WITHOUT CONTRAST CT ANGIOGRAPHY HEAD AND NECK CT PERFUSION BRAIN TECHNIQUE: Multidetector CT imaging of the head, and CTA head and neck was performed using the standard protocol during bolus administration of intravenous contrast. Multiplanar CT image reconstructions and MIPs were obtained to evaluate the vascular anatomy. Carotid stenosis measurements (when applicable) are obtained utilizing NASCET criteria, using the distal internal carotid diameter as the denominator. Multiphase CT imaging of the brain was performed following IV bolus contrast injection. Subsequent parametric perfusion maps were calculated using RAPID software. CONTRAST:  OMNIPAQUE IOHEXOL 350 MG/ML SOLN COMPARISON:  Head CT 05/26/2019. FINDINGS: CT HEAD FINDINGS Brain: Cerebral volume is stable and within normal limits for age. No midline shift, ventriculomegaly, mass effect, evidence of mass lesion, intracranial hemorrhage or evidence of cortically based acute infarction. Gray-white matter differentiation is within normal limits throughout the brain. Chronic partially empty sella. No cortical encephalomalacia identified. Vascular: Calcified atherosclerosis at the skull base. No suspicious intracranial vascular hyperdensity. Skull: No acute osseous abnormality identified. Sinuses/Orbits: Visualized paranasal sinuses and mastoids are  stable and well pneumatized. Other: Visualized orbits and scalp soft tissues are within normal limits. ASPECTS [facility] Stroke Program Early  CT Score) Total score (0-10 with 10 being normal): 10 Review of the MIP images confirms the above findings CT Brain Perfusion Findings: ASPECTS: 10 CBF (<30%) Volume: 69mL Perfusion (Tmax>6.0s) volume: 75mL (anterior inferior left temporal lobe, likely artifact) Mismatch Volume: 32mL Infarction Location:Not applicable CTA NECK Skeleton: Absent dentition. Cervical disc and endplate degeneration. No acute osseous abnormality identified. Upper chest: Negative. Other neck: Negative. Aortic arch: 3 vessel arch configuration with mild great vessel origin atherosclerosis. Right carotid system: Soft and calcified plaque in the proximal brachiocephalic artery but no significant stenosis. Negative right CCA origin. Mildly tortuous right CCA. Calcified plaque at the right ICA origin without stenosis. Tortuous vessel distal to the bulb with a kinked appearance (series 9, image 22). Left carotid system: Mild plaque at the left CCA origin without stenosis. Tortuous left CCA. No significant plaque at the left carotid bifurcation or proximal ICA. Tortuous left ICA distal to the bulb similar to the right side with a kinked appearance. Vertebral arteries: Normal proximal right subclavian artery and right vertebral artery origin. The right vertebral artery is patent and within normal limits to the skull base. Mild plaque in the proximal left subclavian artery without stenosis. Normal left vertebral artery origin. Tortuous left V1 segment. The left vertebral is patent and within normal limits to the skull base. CTA HEAD Posterior circulation: The distal right vertebral artery is mildly dominant especially beyond the left PICA origin. No distal vertebral stenosis. Patent vertebrobasilar junction and basilar artery without stenosis. The right AICA appears dominant. Normal SCA and PCA origins. Posterior communicating arteries are diminutive or absent. Bilateral PCA branches are within normal limits. Anterior circulation: Both ICA siphons are  patent. Moderate calcified plaque of the left siphon cavernous segment with associated moderate to severe stenosis in the distal cavernous segment (radiographic string sign series 6, image 93 and series 7, image 105 just proximal to the anterior genu. Supraclinoid left ICA remains patent and within normal limits. Mild to moderate right cavernous ICA calcified plaque and mild to moderate stenosis (series 5, image 109). Patent and negative supraclinoid right ICA. Patent carotid termini, MCA and ACA origins. Anterior communicating artery is diminutive or absent. Bilateral ACA branches are within normal limits. Left MCA M1 segment and bifurcation are patent without stenosis. Right MCA M1 segment and bifurcation are patent without stenosis. Bilateral MCA branches are within normal limits. Venous sinuses: Patent. Anatomic variants: Mildly dominant right vertebral artery. Review of the MIP images confirms the above findings IMPRESSION: 1. Negative for large vessel occlusion. No core infarct detected by CT Perfusion. Stable and negative plain CT appearance of the brain. 2. 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 There is contralateral mild to moderate right ICA siphon plaque and stenosis. 3. But there is very little extracranial atherosclerosis, with no significant vertebral artery or cervical carotid stenosis. Electronically Signed   By: Odessa Fleming M.D.   On: 05/28/2020 18:32   CT HEAD CODE STROKE WO CONTRAST  Result Date: 05/28/2020 CLINICAL DATA:  Code stroke. 59 year old female with sudden onset left side weakness and heaviness. EXAM: CT HEAD WITHOUT CONTRAST CT ANGIOGRAPHY HEAD AND NECK CT PERFUSION BRAIN TECHNIQUE: Multidetector CT imaging of the head, and CTA head and neck was performed using the standard protocol during bolus administration of intravenous contrast. Multiplanar CT image reconstructions and MIPs were  obtained to evaluate the vascular  anatomy. Carotid stenosis measurements (when applicable) are obtained utilizing NASCET criteria, using the distal internal carotid diameter as the denominator. Multiphase CT imaging of the brain was performed following IV bolus contrast injection. Subsequent parametric perfusion maps were calculated using RAPID software. CONTRAST:  OMNIPAQUE IOHEXOL 350 MG/ML SOLN COMPARISON:  Head CT 05/26/2019. FINDINGS: CT HEAD FINDINGS Brain: Cerebral volume is stable and within normal limits for age. No midline shift, ventriculomegaly, mass effect, evidence of mass lesion, intracranial hemorrhage or evidence of cortically based acute infarction. Gray-white matter differentiation is within normal limits throughout the brain. Chronic partially empty sella. No cortical encephalomalacia identified. Vascular: Calcified atherosclerosis at the skull base. No suspicious intracranial vascular hyperdensity. Skull: No acute osseous abnormality identified. Sinuses/Orbits: Visualized paranasal sinuses and mastoids are stable and well pneumatized. Other: Visualized orbits and scalp soft tissues are within normal limits. ASPECTS [facility] Stroke Program Early CT Score) Total score (0-10 with 10 being normal): 10 Review of the MIP images confirms the above findings CT Brain Perfusion Findings: ASPECTS: 10 CBF (<30%) Volume: 0mL Perfusion (Tmax>6.0s) volume: 3mL (anterior inferior left temporal lobe, likely artifact) Mismatch Volume: 3mL Infarction Location:Not applicable CTA NECK Skeleton: Absent dentition. Cervical disc and endplate degeneration. No acute osseous abnormality identified. Upper chest: Negative. Other neck: Negative. Aortic arch: 3 vessel arch configuration with mild great vessel origin atherosclerosis. Right carotid system: Soft and calcified plaque in the proximal brachiocephalic artery but no significant stenosis. Negative right CCA origin. Mildly tortuous right CCA. Calcified plaque at the right ICA origin without  stenosis. Tortuous vessel distal to the bulb with a kinked appearance (series 9, image 22). Left carotid system: Mild plaque at the left CCA origin without stenosis. Tortuous left CCA. No significant plaque at the left carotid bifurcation or proximal ICA. Tortuous left ICA distal to the bulb similar to the right side with a kinked appearance. Vertebral arteries: Normal proximal right subclavian artery and right vertebral artery origin. The right vertebral artery is patent and within normal limits to the skull base. Mild plaque in the proximal left subclavian artery without stenosis. Normal left vertebral artery origin. Tortuous left V1 segment. The left vertebral is patent and within normal limits to the skull base. CTA HEAD Posterior circulation: The distal right vertebral artery is mildly dominant especially beyond the left PICA origin. No distal vertebral stenosis. Patent vertebrobasilar junction and basilar artery without stenosis. The right AICA appears dominant. Normal SCA and PCA origins. Posterior communicating arteries are diminutive or absent. Bilateral PCA branches are within normal limits. Anterior circulation: Both ICA siphons are patent. Moderate calcified plaque of the left siphon cavernous segment with associated moderate to severe stenosis in the distal cavernous segment (radiographic string sign series 6, image 93 and series 7, image 105 just proximal to the anterior genu. Supraclinoid left ICA remains patent and within normal limits. Mild to moderate right cavernous ICA calcified plaque and mild to moderate stenosis (series 5, image 109). Patent and negative supraclinoid right ICA. Patent carotid termini, MCA and ACA origins. Anterior communicating artery is diminutive or absent. Bilateral ACA branches are within normal limits. Left MCA M1 segment and bifurcation are patent without stenosis. Right MCA M1 segment and bifurcation are patent without stenosis. Bilateral MCA branches are within normal  limits. Venous sinuses: Patent. Anatomic variants: Mildly dominant right vertebral artery. Review of the MIP images confirms the above findings IMPRESSION: 1. Negative for large vessel occlusion. No core infarct detected by CT Perfusion. Stable and  negative plain CT appearance of the brain. 2. 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 There is contralateral mild to moderate right ICA siphon plaque and stenosis. 3. But there is very little extracranial atherosclerosis, with no significant vertebral artery or cervical carotid stenosis. Electronically Signed   By: Odessa Fleming M.D.   On: 05/28/2020 18:32    Assessment: 59 y.o. female with a history of CAD, HTN and DM presenting with left sided headache and left sided "heaviness".  On neurological examination noted to have some mild LLE weakness that is unclear if related to pain.  At baseline has right sided complaints from her back pathology.  Head CT personally reviewed and shows no acute changes.  CTA of the head and neck personally reviewed as well. No evidence of LVO.  Severe stenosis noted at the left ICA for which patient would be considered asymptomatic.  Mild to moderate ICA stenosis.   Patient on ASA daily.   Acute infarct remains on the differential due to patient's multiple risk factors, as well as symptoms from cervical pathology which from prior films are severe enough to cause cervical myelomalacia.  BP elevated as well.  Further work up recommended.    Stroke Risk Factors - diabetes mellitus and hypertension  Plan: 1. HgbA1c, fasting lipid panel 2. MRI of the brain without contrast.  If no evidence of acute infarct, further stroke work up not indicated at this time.   3. PT consult, OT consult, Speech consult 4. Prophylactic therapy-Continue ASA 5. NPO until RN stroke swallow screen 6. Telemetry monitoring 7. Frequent neuro checks 8. Liberal BP control until acute infarct ruled  out with MR imaging.   9. Vascular to see patient for asymptomatic left ICA stenosis.     Thana Farr, MD Neurology  05/28/2020, 6:55 PM

## 2020-05-28 NOTE — Progress Notes (Signed)
   05/28/20 1800  Clinical Encounter Type  Visited With Patient not available;Health care provider  Visit Type Initial;Code  Referral From Nurse   Code STROKE page received for the patient. Upon arrival, I noted that the patient had been taken for testing/evaluation. Will follow up at a later time.  Clovis Riley, Chaplain

## 2020-05-28 NOTE — ED Notes (Signed)
Pt at CT

## 2020-05-28 NOTE — Progress Notes (Signed)
CODE STROKE- PHARMACY COMMUNICATION   Time CODE STROKE called/page received: 1737  Time response to CODE STROKE was made (in person or via phone): 1739 in person  Time Stroke Kit retrieved from [place] (only if needed): N/A  Name of Provider/Nurse contacted: N/A  Past Medical History:  Diagnosis Date  . Diabetes mellitus without complication ([place])   . Heart attack ([place])   . Hypertension    Prior to Admission medications   Medication Sig Start Date End Date Taking? Authorizing Provider  amLODipine (NORVASC) 10 MG tablet Take 10 mg by mouth daily.    [provider]  aspirin 81 MG chewable tablet Chew 81 mg by mouth daily.    [provider]  carvedilol (COREG) 12.5 MG tablet Take 12.5 mg by mouth 2 (two) times daily with a meal.    [provider]  gabapentin (NEURONTIN) 100 MG capsule Take 1 capsule (100 mg total) by mouth 3 (three) times daily for 10 days. 07/26/19 08/05/19  Duffy Bruce, MD  glimepiride (AMARYL) 2 MG tablet Take 2 mg by mouth daily with breakfast.    [provider]  metFORMIN (GLUCOPHAGE) 500 MG tablet Take 500 mg by mouth 2 (two) times daily with a meal.    [provider]  oxyCODONE-acetaminophen (PERCOCET) 5-325 MG tablet Take 1-2 tablets by mouth every 6 (six) hours as needed for moderate pain or severe pain. 07/28/19 07/27/20  Sable Feil, PA-C    Sherilyn Banker, PharmD Pharmacy Resident  05/28/2020 5:51 PM

## 2020-05-28 NOTE — ED Provider Notes (Signed)
[facility] Emergency Department Provider Note   ____________________________________________   First MD Initiated Contact with Patient 05/28/20 1741     (approximate)  I have reviewed the triage vital signs and the nursing notes.   HISTORY  Chief Complaint Weakness  EM caveat: Acute neurologic symptoms, acuity of care  HPI Penny Medina is a 59 y.o. female history of diabetes and previous coronary disease  Went to bed last night about 10 AM, awoke roughly 3 in the morning with a headache also noticing difficulty feeling of heaviness particularly in her left leg and some numbness in that area as well  No fevers or chills.  No chest pain.  No nausea or vomiting.  No shortness of breath.  Reports ongoing headache moderate in intensity  Past Medical History:  Diagnosis Date  . Diabetes mellitus without complication (HCC)   . Heart attack (HCC)   . Hypertension     Patient Active Problem List   Diagnosis Date Noted  . Stroke-like symptoms 05/28/2020  . Essential hypertension 05/28/2020  . Type 2 diabetes mellitus without complication (HCC) 05/28/2020  . Obesity, Class III, BMI 40-49.9 (morbid obesity) (HCC) 05/28/2020    Past Surgical History:  Procedure Laterality Date  . CESAREAN SECTION    . TUBAL LIGATION      Prior to Admission medications   Medication Sig Start Date End Date Taking? Authorizing Provider  amLODipine (NORVASC) 10 MG tablet Take 10 mg by mouth daily.   Yes [provider]  aspirin 81 MG chewable tablet Chew 81 mg by mouth daily.   Yes [provider]  gabapentin (NEURONTIN) 300 MG capsule Take 300 mg by mouth 3 (three) times daily. 12/28/19 05/13/21 Yes [provider]  glipiZIDE (GLUCOTROL) 5 MG tablet Take 5 mg by mouth 2 (two) times daily before a meal.   Yes [provider]  metFORMIN (GLUCOPHAGE) 500 MG tablet Take 500 mg by mouth 2 (two) times daily with a meal.   Yes [provider]  methocarbamol (ROBAXIN) 500 MG tablet Take 500 mg by mouth every 8 (eight) hours as needed for muscle spasms. 05/13/20 06/12/20 Yes [provider]  carvedilol (COREG) 12.5 MG tablet Take 12.5 mg by mouth 2 (two) times daily with a meal. Patient not taking: Reported on 05/28/2020    [provider]  glimepiride (AMARYL) 2 MG tablet Take 2 mg by mouth daily with breakfast. Patient not taking: Reported on 05/28/2020    [provider]    Allergies Penicillins and Sulfa antibiotics  Family History  Problem Relation Age of Onset  . Diabetes Mother   . CVA Mother   . Hypertension Father     Social History Social History   Tobacco Use  . Smoking status: Former Games developer  . Smokeless tobacco: Never Used  Substance Use Topics  . Alcohol use: No  . Drug use: Never    Review of Systems Constitutional: No fever/chills Eyes: No visual changes. ENT: No sore throat.  No neck pain Cardiovascular: Denies chest pain. Respiratory: Denies shortness of breath. Gastrointestinal: No abdominal pain.   Genitourinary: Negative for dysuria. Musculoskeletal: Negative for back pain.  Weakness particularly in the left lower leg Skin: Negative for rash. Neurological: See HPI    ____________________________________________   PHYSICAL EXAM:  VITAL SIGNS: ED Triage Vitals  Enc Vitals Group     BP 05/28/20 1717 (!) 176/99     Pulse Rate 05/28/20 1717 86     Resp  05/28/20 1717 18     Temp 05/28/20 1717 98.6 F (37 C)     Temp Source 05/28/20 1717 Oral     SpO2 05/28/20 1717 98 %     Weight 05/28/20 1718 260 lb 9.3 oz (118.2 kg)     Height 05/28/20 1718 5\' 5"  (1.651 m)     Head Circumference --      Peak Flow --      Pain Score 05/28/20 1717 8     Pain Loc --      Pain Edu? --      Excl. in GC? --     Constitutional: Alert and oriented. Well appearing and in no acute distress. Eyes: Conjunctivae are normal. Head: Atraumatic.  No obvious facial  asymmetry nose: No congestion/rhinnorhea. Mouth/Throat: Mucous membranes are moist. Neck: No stridor.  Cardiovascular: Normal rate, regular rhythm. Grossly normal heart sounds.  Good peripheral circulation. Respiratory: Normal respiratory effort.  No retractions. Lungs CTAB. Gastrointestinal: Soft and nontender. No distention. Musculoskeletal: No lower extremity tenderness nor edema. Neurologic: Slight speech abnormality.  Slight feeling of drift.  NIH score equals 3, performed in part by neurology stroke team MD as well Skin:  Skin is warm, dry and intact. No rash noted. Psychiatric: Mood and affect are normal. Speech and behavior are normal.  ____________________________________________   LABS (all labs ordered are listed, but only abnormal results are displayed)  Labs Reviewed  CBC - Abnormal; Notable for the following components:      Result Value   RBC 6.36 (*)    MCV 66.8 (*)    MCH 20.0 (*)    MCHC 29.9 (*)    RDW 17.1 (*)    All other components within normal limits  COMPREHENSIVE METABOLIC PANEL - Abnormal; Notable for the following components:   Glucose, Bld 215 (*)    Total Protein 8.5 (*)    All other components within normal limits  GLUCOSE, CAPILLARY - Abnormal; Notable for the following components:   Glucose-Capillary 198 (*)    All other components within normal limits  RESPIRATORY PANEL BY RT PCR (FLU A&B, COVID)  PROTIME-INR  APTT  DIFFERENTIAL  URINE DRUG SCREEN, QUALITATIVE ([facility] ONLY)  HEMOGLOBIN A1C  I-STAT CREATININE, ED  CBG MONITORING, ED   ____________________________________________  EKG  Reviewed and interpreted by me at 1829 heart rate 99 QRS 99 QTc 459 normal sinus rhythm, slight artifact.  No evidence of acute ischemia or ectopy denoted ____________________________________________  RADIOLOGY    Head CT reviewed by me briefly, preliminarily I do not see any evidence of acute hemorrhage.  Await final read by radiologist   CT Angio  Head W or Wo Contrast  Addendum Date: 05/28/2020   ADDENDUM REPORT: 05/28/2020 18:58 ADDENDUM: Study discussed by telephone with Dr. 05/30/2020 on 05/28/2020 at 1837 hours. Electronically Signed   By: 05/30/2020 M.D.   On: 05/28/2020 18:58   Result Date: 05/28/2020 CLINICAL DATA:  Code stroke. 59 year old female with sudden onset left side weakness and heaviness. EXAM: CT HEAD WITHOUT CONTRAST CT ANGIOGRAPHY HEAD AND NECK CT PERFUSION BRAIN TECHNIQUE: Multidetector CT imaging of the head, and CTA head and neck was performed using the standard protocol during bolus administration of intravenous contrast. Multiplanar CT image reconstructions and MIPs were obtained to evaluate the vascular anatomy. Carotid stenosis measurements (when applicable) are obtained utilizing NASCET criteria, using the distal internal carotid diameter as the denominator. Multiphase CT imaging of the brain was performed following IV bolus contrast injection. Subsequent  parametric perfusion maps were calculated using RAPID software. CONTRAST:  OMNIPAQUE IOHEXOL 350 MG/ML SOLN COMPARISON:  Head CT 05/26/2019. FINDINGS: CT HEAD FINDINGS Brain: Cerebral volume is stable and within normal limits for age. No midline shift, ventriculomegaly, mass effect, evidence of mass lesion, intracranial hemorrhage or evidence of cortically based acute infarction. Gray-white matter differentiation is within normal limits throughout the brain. Chronic partially empty sella. No cortical encephalomalacia identified. Vascular: Calcified atherosclerosis at the skull base. No suspicious intracranial vascular hyperdensity. Skull: No acute osseous abnormality identified. Sinuses/Orbits: Visualized paranasal sinuses and mastoids are stable and well pneumatized. Other: Visualized orbits and scalp soft tissues are within normal limits. ASPECTS [facility] Stroke Program Early CT Score) Total score (0-10 with 10 being normal): 10 Review of the MIP images confirms the above  findings CT Brain Perfusion Findings: ASPECTS: 10 CBF (<30%) Volume: 0mL Perfusion (Tmax>6.0s) volume: 3mL (anterior inferior left temporal lobe, likely artifact) Mismatch Volume: 3mL Infarction Location:Not applicable CTA NECK Skeleton: Absent dentition. Cervical disc and endplate degeneration. No acute osseous abnormality identified. Upper chest: Negative. Other neck: Negative. Aortic arch: 3 vessel arch configuration with mild great vessel origin atherosclerosis. Right carotid system: Soft and calcified plaque in the proximal brachiocephalic artery but no significant stenosis. Negative right CCA origin. Mildly tortuous right CCA. Calcified plaque at the right ICA origin without stenosis. Tortuous vessel distal to the bulb with a kinked appearance (series 9, image 22). Left carotid system: Mild plaque at the left CCA origin without stenosis. Tortuous left CCA. No significant plaque at the left carotid bifurcation or proximal ICA. Tortuous left ICA distal to the bulb similar to the right side with a kinked appearance. Vertebral arteries: Normal proximal right subclavian artery and right vertebral artery origin. The right vertebral artery is patent and within normal limits to the skull base. Mild plaque in the proximal left subclavian artery without stenosis. Normal left vertebral artery origin. Tortuous left V1 segment. The left vertebral is patent and within normal limits to the skull base. CTA HEAD Posterior circulation: The distal right vertebral artery is mildly dominant especially beyond the left PICA origin. No distal vertebral stenosis. Patent vertebrobasilar junction and basilar artery without stenosis. The right AICA appears dominant. Normal SCA and PCA origins. Posterior communicating arteries are diminutive or absent. Bilateral PCA branches are within normal limits. Anterior circulation: Both ICA siphons are patent. Moderate calcified plaque of the left siphon cavernous segment with associated moderate  to severe stenosis in the distal cavernous segment (radiographic string sign series 6, image 93 and series 7, image 105 just proximal to the anterior genu. Supraclinoid left ICA remains patent and within normal limits. Mild to moderate right cavernous ICA calcified plaque and mild to moderate stenosis (series 5, image 109). Patent and negative supraclinoid right ICA. Patent carotid termini, MCA and ACA origins. Anterior communicating artery is diminutive or absent. Bilateral ACA branches are within normal limits. Left MCA M1 segment and bifurcation are patent without stenosis. Right MCA M1 segment and bifurcation are patent without stenosis. Bilateral MCA branches are within normal limits. Venous sinuses: Patent. Anatomic variants: Mildly dominant right vertebral artery. Review of the MIP images confirms the above findings IMPRESSION: 1. Negative for large vessel occlusion. No core infarct detected by CT Perfusion. Stable and negative plain CT appearance of the brain. 2. 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 There is contralateral mild to moderate right ICA siphon plaque and stenosis. 3. But there is  very little extracranial atherosclerosis, with no significant vertebral artery or cervical carotid stenosis. Electronically Signed: By: Odessa FlemingH  Hall M.D. On: 05/28/2020 18:32   DG Chest 2 View  Result Date: 05/28/2020 CLINICAL DATA:  Left-sided weakness and headache.  Hypertension EXAM: CHEST - 2 VIEW COMPARISON:  CT angiogram chest July 26, 2019 FINDINGS: Lungs are clear. There is cardiomegaly with pulmonary vascularity normal. No adenopathy. No bone lesions. IMPRESSION: Cardiomegaly.  Lungs clear.  No adenopathy evident. Electronically Signed   By: Bretta BangWilliam  Woodruff III M.D.   On: 05/28/2020 19:27   CT Angio Neck W and/or Wo Contrast  Addendum Date: 05/28/2020   ADDENDUM REPORT: 05/28/2020 18:58 ADDENDUM: Study discussed by telephone with  Dr. Fanny BienQuale on 05/28/2020 at 1837 hours. Electronically Signed   By: Odessa FlemingH  Hall M.D.   On: 05/28/2020 18:58   Result Date: 05/28/2020 CLINICAL DATA:  Code stroke. 59 year old female with sudden onset left side weakness and heaviness. EXAM: CT HEAD WITHOUT CONTRAST CT ANGIOGRAPHY HEAD AND NECK CT PERFUSION BRAIN TECHNIQUE: Multidetector CT imaging of the head, and CTA head and neck was performed using the standard protocol during bolus administration of intravenous contrast. Multiplanar CT image reconstructions and MIPs were obtained to evaluate the vascular anatomy. Carotid stenosis measurements (when applicable) are obtained utilizing NASCET criteria, using the distal internal carotid diameter as the denominator. Multiphase CT imaging of the brain was performed following IV bolus contrast injection. Subsequent parametric perfusion maps were calculated using RAPID software. CONTRAST:  100mL OMNIPAQUE IOHEXOL 350 MG/ML SOLN COMPARISON:  Head CT 05/26/2019. FINDINGS: CT HEAD FINDINGS Brain: Cerebral volume is stable and within normal limits for age. No midline shift, ventriculomegaly, mass effect, evidence of mass lesion, intracranial hemorrhage or evidence of cortically based acute infarction. Gray-white matter differentiation is within normal limits throughout the brain. Chronic partially empty sella. No cortical encephalomalacia identified. Vascular: Calcified atherosclerosis at the skull base. No suspicious intracranial vascular hyperdensity. Skull: No acute osseous abnormality identified. Sinuses/Orbits: Visualized paranasal sinuses and mastoids are stable and well pneumatized. Other: Visualized orbits and scalp soft tissues are within normal limits. ASPECTS [facility]([place] Stroke Program Early CT Score) Total score (0-10 with 10 being normal): 10 Review of the MIP images confirms the above findings CT Brain Perfusion Findings: ASPECTS: 10 CBF (<30%) Volume: 0mL Perfusion (Tmax>6.0s) volume: 3mL (anterior inferior left  temporal lobe, likely artifact) Mismatch Volume: 3mL Infarction Location:Not applicable CTA NECK Skeleton: Absent dentition. Cervical disc and endplate degeneration. No acute osseous abnormality identified. Upper chest: Negative. Other neck: Negative. Aortic arch: 3 vessel arch configuration with mild great vessel origin atherosclerosis. Right carotid system: Soft and calcified plaque in the proximal brachiocephalic artery but no significant stenosis. Negative right CCA origin. Mildly tortuous right CCA. Calcified plaque at the right ICA origin without stenosis. Tortuous vessel distal to the bulb with a kinked appearance (series 9, image 22). Left carotid system: Mild plaque at the left CCA origin without stenosis. Tortuous left CCA. No significant plaque at the left carotid bifurcation or proximal ICA. Tortuous left ICA distal to the bulb similar to the right side with a kinked appearance. Vertebral arteries: Normal proximal right subclavian artery and right vertebral artery origin. The right vertebral artery is patent and within normal limits to the skull base. Mild plaque in the proximal left subclavian artery without stenosis. Normal left vertebral artery origin. Tortuous left V1 segment. The left vertebral is patent and within normal limits to the skull base. CTA HEAD Posterior circulation: The distal right vertebral artery is  mildly dominant especially beyond the left PICA origin. No distal vertebral stenosis. Patent vertebrobasilar junction and basilar artery without stenosis. The right AICA appears dominant. Normal SCA and PCA origins. Posterior communicating arteries are diminutive or absent. Bilateral PCA branches are within normal limits. Anterior circulation: Both ICA siphons are patent. Moderate calcified plaque of the left siphon cavernous segment with associated moderate to severe stenosis in the distal cavernous segment (radiographic string sign series 6, image 93 and series 7, image 105 just  proximal to the anterior genu. Supraclinoid left ICA remains patent and within normal limits. Mild to moderate right cavernous ICA calcified plaque and mild to moderate stenosis (series 5, image 109). Patent and negative supraclinoid right ICA. Patent carotid termini, MCA and ACA origins. Anterior communicating artery is diminutive or absent. Bilateral ACA branches are within normal limits. Left MCA M1 segment and bifurcation are patent without stenosis. Right MCA M1 segment and bifurcation are patent without stenosis. Bilateral MCA branches are within normal limits. Venous sinuses: Patent. Anatomic variants: Mildly dominant right vertebral artery. Review of the MIP images confirms the above findings IMPRESSION: 1. Negative for large vessel occlusion. No core infarct detected by CT Perfusion. Stable and negative plain CT appearance of the brain. 2. 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 There is contralateral mild to moderate right ICA siphon plaque and stenosis. 3. But there is very little extracranial atherosclerosis, with no significant vertebral artery or cervical carotid stenosis. Electronically Signed: By: Odessa Fleming M.D. On: 05/28/2020 18:32   CT CEREBRAL PERFUSION W CONTRAST  Addendum Date: 05/28/2020   ADDENDUM REPORT: 05/28/2020 18:58 ADDENDUM: Study discussed by telephone with Dr. Fanny Bien on 05/28/2020 at 1837 hours. Electronically Signed   By: Odessa Fleming M.D.   On: 05/28/2020 18:58   Result Date: 05/28/2020 CLINICAL DATA:  Code stroke. 59 year old female with sudden onset left side weakness and heaviness. EXAM: CT HEAD WITHOUT CONTRAST CT ANGIOGRAPHY HEAD AND NECK CT PERFUSION BRAIN TECHNIQUE: Multidetector CT imaging of the head, and CTA head and neck was performed using the standard protocol during bolus administration of intravenous contrast. Multiplanar CT image reconstructions and MIPs were obtained to evaluate the vascular anatomy.  Carotid stenosis measurements (when applicable) are obtained utilizing NASCET criteria, using the distal internal carotid diameter as the denominator. Multiphase CT imaging of the brain was performed following IV bolus contrast injection. Subsequent parametric perfusion maps were calculated using RAPID software. CONTRAST:  OMNIPAQUE IOHEXOL 350 MG/ML SOLN COMPARISON:  Head CT 05/26/2019. FINDINGS: CT HEAD FINDINGS Brain: Cerebral volume is stable and within normal limits for age. No midline shift, ventriculomegaly, mass effect, evidence of mass lesion, intracranial hemorrhage or evidence of cortically based acute infarction. Gray-white matter differentiation is within normal limits throughout the brain. Chronic partially empty sella. No cortical encephalomalacia identified. Vascular: Calcified atherosclerosis at the skull base. No suspicious intracranial vascular hyperdensity. Skull: No acute osseous abnormality identified. Sinuses/Orbits: Visualized paranasal sinuses and mastoids are stable and well pneumatized. Other: Visualized orbits and scalp soft tissues are within normal limits. ASPECTS [facility] Stroke Program Early CT Score) Total score (0-10 with 10 being normal): 10 Review of the MIP images confirms the above findings CT Brain Perfusion Findings: ASPECTS: 10 CBF (<30%) Volume: 17mL Perfusion (Tmax>6.0s) volume: 22mL (anterior inferior left temporal lobe, likely artifact) Mismatch Volume: 55mL Infarction Location:Not applicable CTA NECK Skeleton: Absent dentition. Cervical disc and endplate degeneration. No acute osseous abnormality identified. Upper chest: Negative. Other neck:  Negative. Aortic arch: 3 vessel arch configuration with mild great vessel origin atherosclerosis. Right carotid system: Soft and calcified plaque in the proximal brachiocephalic artery but no significant stenosis. Negative right CCA origin. Mildly tortuous right CCA. Calcified plaque at the right ICA origin without stenosis.  Tortuous vessel distal to the bulb with a kinked appearance (series 9, image 22). Left carotid system: Mild plaque at the left CCA origin without stenosis. Tortuous left CCA. No significant plaque at the left carotid bifurcation or proximal ICA. Tortuous left ICA distal to the bulb similar to the right side with a kinked appearance. Vertebral arteries: Normal proximal right subclavian artery and right vertebral artery origin. The right vertebral artery is patent and within normal limits to the skull base. Mild plaque in the proximal left subclavian artery without stenosis. Normal left vertebral artery origin. Tortuous left V1 segment. The left vertebral is patent and within normal limits to the skull base. CTA HEAD Posterior circulation: The distal right vertebral artery is mildly dominant especially beyond the left PICA origin. No distal vertebral stenosis. Patent vertebrobasilar junction and basilar artery without stenosis. The right AICA appears dominant. Normal SCA and PCA origins. Posterior communicating arteries are diminutive or absent. Bilateral PCA branches are within normal limits. Anterior circulation: Both ICA siphons are patent. Moderate calcified plaque of the left siphon cavernous segment with associated moderate to severe stenosis in the distal cavernous segment (radiographic string sign series 6, image 93 and series 7, image 105 just proximal to the anterior genu. Supraclinoid left ICA remains patent and within normal limits. Mild to moderate right cavernous ICA calcified plaque and mild to moderate stenosis (series 5, image 109). Patent and negative supraclinoid right ICA. Patent carotid termini, MCA and ACA origins. Anterior communicating artery is diminutive or absent. Bilateral ACA branches are within normal limits. Left MCA M1 segment and bifurcation are patent without stenosis. Right MCA M1 segment and bifurcation are patent without stenosis. Bilateral MCA branches are within normal limits.  Venous sinuses: Patent. Anatomic variants: Mildly dominant right vertebral artery. Review of the MIP images confirms the above findings IMPRESSION: 1. Negative for large vessel occlusion. No core infarct detected by CT Perfusion. Stable and negative plain CT appearance of the brain. 2. 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 There is contralateral mild to moderate right ICA siphon plaque and stenosis. 3. But there is very little extracranial atherosclerosis, with no significant vertebral artery or cervical carotid stenosis. Electronically Signed: By: Odessa Fleming M.D. On: 05/28/2020 18:32   CT HEAD CODE STROKE WO CONTRAST  Addendum Date: 05/28/2020   ADDENDUM REPORT: 05/28/2020 18:58 ADDENDUM: Study discussed by telephone with Dr. Fanny Bien on 05/28/2020 at 1837 hours. Electronically Signed   By: Odessa Fleming M.D.   On: 05/28/2020 18:58   Result Date: 05/28/2020 CLINICAL DATA:  Code stroke. 59 year old female with sudden onset left side weakness and heaviness. EXAM: CT HEAD WITHOUT CONTRAST CT ANGIOGRAPHY HEAD AND NECK CT PERFUSION BRAIN TECHNIQUE: Multidetector CT imaging of the head, and CTA head and neck was performed using the standard protocol during bolus administration of intravenous contrast. Multiplanar CT image reconstructions and MIPs were obtained to evaluate the vascular anatomy. Carotid stenosis measurements (when applicable) are obtained utilizing NASCET criteria, using the distal internal carotid diameter as the denominator. Multiphase CT imaging of the brain was performed following IV bolus contrast injection. Subsequent parametric perfusion maps were calculated using RAPID software. CONTRAST:  OMNIPAQUE IOHEXOL  350 MG/ML SOLN COMPARISON:  Head CT 05/26/2019. FINDINGS: CT HEAD FINDINGS Brain: Cerebral volume is stable and within normal limits for age. No midline shift, ventriculomegaly, mass effect, evidence of mass lesion,  intracranial hemorrhage or evidence of cortically based acute infarction. Gray-white matter differentiation is within normal limits throughout the brain. Chronic partially empty sella. No cortical encephalomalacia identified. Vascular: Calcified atherosclerosis at the skull base. No suspicious intracranial vascular hyperdensity. Skull: No acute osseous abnormality identified. Sinuses/Orbits: Visualized paranasal sinuses and mastoids are stable and well pneumatized. Other: Visualized orbits and scalp soft tissues are within normal limits. ASPECTS [facility] Stroke Program Early CT Score) Total score (0-10 with 10 being normal): 10 Review of the MIP images confirms the above findings CT Brain Perfusion Findings: ASPECTS: 10 CBF (<30%) Volume: 0mL Perfusion (Tmax>6.0s) volume: 3mL (anterior inferior left temporal lobe, likely artifact) Mismatch Volume: 3mL Infarction Location:Not applicable CTA NECK Skeleton: Absent dentition. Cervical disc and endplate degeneration. No acute osseous abnormality identified. Upper chest: Negative. Other neck: Negative. Aortic arch: 3 vessel arch configuration with mild great vessel origin atherosclerosis. Right carotid system: Soft and calcified plaque in the proximal brachiocephalic artery but no significant stenosis. Negative right CCA origin. Mildly tortuous right CCA. Calcified plaque at the right ICA origin without stenosis. Tortuous vessel distal to the bulb with a kinked appearance (series 9, image 22). Left carotid system: Mild plaque at the left CCA origin without stenosis. Tortuous left CCA. No significant plaque at the left carotid bifurcation or proximal ICA. Tortuous left ICA distal to the bulb similar to the right side with a kinked appearance. Vertebral arteries: Normal proximal right subclavian artery and right vertebral artery origin. The right vertebral artery is patent and within normal limits to the skull base. Mild plaque in the proximal left subclavian artery without  stenosis. Normal left vertebral artery origin. Tortuous left V1 segment. The left vertebral is patent and within normal limits to the skull base. CTA HEAD Posterior circulation: The distal right vertebral artery is mildly dominant especially beyond the left PICA origin. No distal vertebral stenosis. Patent vertebrobasilar junction and basilar artery without stenosis. The right AICA appears dominant. Normal SCA and PCA origins. Posterior communicating arteries are diminutive or absent. Bilateral PCA branches are within normal limits. Anterior circulation: Both ICA siphons are patent. Moderate calcified plaque of the left siphon cavernous segment with associated moderate to severe stenosis in the distal cavernous segment (radiographic string sign series 6, image 93 and series 7, image 105 just proximal to the anterior genu. Supraclinoid left ICA remains patent and within normal limits. Mild to moderate right cavernous ICA calcified plaque and mild to moderate stenosis (series 5, image 109). Patent and negative supraclinoid right ICA. Patent carotid termini, MCA and ACA origins. Anterior communicating artery is diminutive or absent. Bilateral ACA branches are within normal limits. Left MCA M1 segment and bifurcation are patent without stenosis. Right MCA M1 segment and bifurcation are patent without stenosis. Bilateral MCA branches are within normal limits. Venous sinuses: Patent. Anatomic variants: Mildly dominant right vertebral artery. Review of the MIP images confirms the above findings IMPRESSION: 1. Negative for large vessel occlusion. No core infarct detected by CT Perfusion. Stable and negative plain CT appearance of the brain. 2. 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 There is contralateral mild to moderate right ICA siphon plaque and stenosis. 3. But there is very little extracranial atherosclerosis, with no significant vertebral artery or  cervical carotid  stenosis. Electronically Signed: By: Odessa Fleming M.D. On: 05/28/2020 18:32     ____________________________________________   PROCEDURES  Procedure(s) performed: None  Procedures  Critical Care performed: Yes, see critical care note(s)   CRITICAL CARE Performed by: Sharyn Creamer   Total critical care time: 35 minutes  Critical care time was exclusive of separately billable procedures and treating other patients.  Critical care was necessary to treat or prevent imminent or life-threatening deterioration.  Critical care was time spent personally by me on the following activities: development of treatment plan with patient and/or surrogate as well as nursing, discussions with consultants, evaluation of patient's response to treatment, examination of patient, obtaining history from patient or surrogate, ordering and performing treatments and interventions, ordering and review of laboratory studies, ordering and review of radiographic studies, pulse oximetry and re-evaluation of patient's condition.   Code stroke activation for concerns of acute neurologic symptoms. ____________________________________________   INITIAL IMPRESSION / ASSESSMENT AND PLAN / ED COURSE  Pertinent labs & imaging results that were available during my care of the patient were reviewed by me and considered in my medical decision making (see chart for details).   Patient presents with headache associated with some speech difficulty as well as weakness over the left lower leg.  Activated as code stroke.  Last known well approximately 10 PM last night.  Patient is well outside of the TPA treatment window, and her exam seemed inconsistent with large vessel occlusion though we await angiography and perfusion study results  Clinical Course as of May 29 1999  Wed May 28, 2020  1745 Dr. Adline Peals present. Stroke team present. I am currently waiting for patient to arrive from CT   [MQ]  1759 Patient  remains at CT with Dr. Thad Ranger   [MQ]    Clinical Course User Index [MQ] Sharyn Creamer, MD   Neurologic symptoms, primarily concern for ischemic stroke as etiology but differential is broad.  No associated acute spinal symptoms such as back pain.  I am concerned also though that this could represent potential for hemorrhage or aneurysmal rupture though this seems low on her differential given her presentation as well as preliminary review of her head CT  Neurology, Dr. Thad Ranger recommends follow-up on CT angio and perfusion studies no Dr. Thad Ranger advises she does not see obvious evidence of LVO per preliminary review.  Asa given.  Admission discussed with hospitalist Dr. Adela Glimpse.  Penny Medina was evaluated in Emergency Department on 05/28/2020 for the symptoms described in the history of present illness. She was evaluated in the context of the global COVID-19 pandemic, which necessitated consideration that the patient might be at risk for infection with the SARS-CoV-2 virus that causes COVID-19. Institutional protocols and algorithms that pertain to the evaluation of patients at risk for COVID-19 are in a state of rapid change based on information released by regulatory bodies including the CDC and federal and state organizations. These policies and algorithms were followed during the patient's care in the ED.   ____________________________________________   FINAL CLINICAL IMPRESSION(S) / ED DIAGNOSES  Final diagnoses:  Left leg weakness  Nonintractable headache, unspecified chronicity pattern, unspecified headache type        Note:  This document was prepared using Dragon voice recognition software and may include unintentional dictation errors       Sharyn Creamer, MD 05/28/20 2001

## 2020-05-28 NOTE — ED Notes (Signed)
Code Stroke called per Dr Erma Heritage

## 2020-05-28 NOTE — H&P (Signed)
Isatou Agredano ONG:[id number] DOB: 07-Feb-1961 DOA: 05/28/2020     PCP: Patient, No Pcp Per   Outpatient Specialists: NONE    Patient arrived to ER on 05/28/20 at 1633 Referred by Attending Sharyn Creamer, MD   Patient coming from: home Lives  With family    Chief Complaint:   Chief Complaint  Patient presents with  . Weakness    HPI: Martha Ellerby is a 59 y.o. female with medical history significant of migraines, DM2, HTN Cervical radiculopathy, CAD    Presented with   Severe headache since 3 AM  Developed some speech problems and heavy ness on the left In afternoon developed left side weakness She initially took Neurontin and Robaxin to help with her headache but with no improvement. 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 There is contralateral mild to moderate right ICA siphon plaque and stenosis.  ER Provider Called:  Neurology    Dr.Reynolds  They Recommend admit to medicine     seen in ER  Hospitalist was called for admission for left side weakness  The following Work up has been ordered so far:  Orders Placed This Encounter  Procedures  . Respiratory Panel by RT PCR (Flu A&B, Covid) - Nasopharyngeal Swab  . CT Angio Head W or Wo Contrast  . CT Angio Neck W and/or Wo Contrast  . CT CEREBRAL PERFUSION W CONTRAST  . CT HEAD CODE STROKE WO CONTRAST  . Protime-INR  . APTT  . CBC  .  Differential  . Comprehensive metabolic panel  . Glucose, capillary  . Diet NPO time specified  . Cardiac monitoring  . Stroke swallow screen  . NIH Stroke Scale  . Modified Stroke Scale (mNIHSS) Document mNIHSS assessment every 2 hours for a total of 12 hours  . Saline Lock IV, Maintain IV access  . If O2 Sat <94% administer O2 at 2 liters/minute via nasal cannula  . Dagoberto Reef to [employer] [phone]  . Call/consult TeleNeurology  Consult Timeframe: STAT - requires a response within one hour; STAT timeframe requires provider to provider communication, has the provider to provider communication been completed: Yes; Reason for Consult? Code stroke  . Consult to hospitalist  ALL PATIENTS BEING ADMITTED/HAVING PROCEDURES NEED COVID-19 SCREENING  . Pulse oximetry, continuous  . I-stat Creatinine, ED  . CBG monitoring, ED  . ED EKG  . EKG 12-Lead    Following Medications were ordered in ER: Medications  aspirin chewable tablet 324 mg (has no administration in time range)  sodium chloride flush (NS) 0.9 % injection 3 mL (3 mLs Intravenous Given 05/28/20 1826)  iohexol (OMNIPAQUE) 350 MG/ML injection 100 mL (100 mLs Intravenous Contrast Given 05/28/20 1757)  ondansetron (ZOFRAN) injection 4 mg (4 mg Intravenous Given 05/28/20 1825)  morphine 4 MG/ML injection 6 mg (6 mg Intravenous Given 05/28/20 1825)        Consult Orders  (From admission, onward)         Start     Ordered   05/28/20 1832  Consult to hospitalist  ALL PATIENTS BEING ADMITTED/HAVING PROCEDURES NEED COVID-19 SCREENING  Once       Comments: ALL PATIENTS BEING ADMITTED/HAVING PROCEDURES NEED COVID-19 SCREENING  Provider:  (Not yet assigned)  Question Answer Comment  Place call to: hospitalist   Reason for Consult Admit   Diagnosis/Clinical Info for Consult: left leg weak, headache, hypertension      05/28/20 1832         Significant initial  Findings: Abnormal Labs Reviewed  CBC -  Abnormal; Notable for the following components:      Result Value   RBC 6.36 (*)    MCV 66.8 (*)    MCH 20.0 (*)    MCHC 29.9 (*)    RDW 17.1 (*)    All other components within normal limits  COMPREHENSIVE METABOLIC PANEL - Abnormal; Notable for the following components:   Glucose, Bld 215 (*)    Total Protein 8.5 (*)    All other components within normal limits  GLUCOSE, CAPILLARY - Abnormal; Notable for the following components:   Glucose-Capillary 198 (*)    All other components within normal limits   Otherwise labs showing:   Recent Labs  Lab 05/28/20 1730  NA 138  K 3.8  CO2 27  GLUCOSE 215*  BUN 6  CREATININE 0.86  CALCIUM 9.8     Recent Labs  Lab 05/28/20 1730  AST 18  ALT 14  ALKPHOS 72  BILITOT 0.6  PROT 8.5*  ALBUMIN 4.2   Lab Results  Component Value Date   CALCIUM 9.8 05/28/2020    WBC      Component Value Date/Time   WBC 6.3 05/28/2020 1730   LYMPHSABS 2.5 05/28/2020 1730   MONOABS 0.5 05/28/2020 1730   EOSABS 0.4 05/28/2020 1730   BASOSABS 0.1 05/28/2020 1730    Plt: Lab Results  Component Value Date   PLT 196 05/28/2020  HG/HCT   stable,       Component Value Date/Time   HGB 12.7 05/28/2020 1730   HCT 42.5 05/28/2020 1730   MCV 66.8 (L) 05/28/2020 1730      ECG: Ordered Personally reviewed by me showing: HR : 92 Rhythm:  NSR    no evidence of ischemic changes QTC 452     CBG (last 3)  Recent Labs    05/28/20 1813  GLUCAP 198*       UA  ordered       Ordered  CT HEAD NON acute  CXR -  NON acute, cardiomegaly    ED Triage Vitals  Enc Vitals Group     BP 05/28/20 1717 (!) 176/99     Pulse Rate 05/28/20 1717 86     Resp 05/28/20 1717 18     Temp 05/28/20 1717 98.6 F (37 C)     Temp Source 05/28/20 1717 Oral     SpO2 05/28/20 1717 98 %     Weight 05/28/20 1718 260 lb 9.3 oz (118.2 kg)     Height 05/28/20 1718 5\' 5"  (1.651 m)     Head Circumference --  Peak Flow --      Pain Score 05/28/20 1717 8       Pain Loc --      Pain Edu? --      Excl. in GC? --   TMAX(24)@       Latest  Blood pressure (!) 176/99, pulse 86, temperature 98.6 F (37 C), temperature source Oral, resp. rate 18, height 5\' 5"  (1.651 m), weight 118.2 kg, SpO2 98 %.      Review of Systems:    Pertinent positives include: left side weakness  Constitutional:  No weight loss, night sweats, Fevers, chills, fatigue, weight loss  HEENT:  No headaches, Difficulty swallowing,Tooth/dental problems,Sore throat,  No sneezing, itching, ear ache, nasal congestion, post nasal drip,  Cardio-vascular:  No chest pain, Orthopnea, PND, anasarca, dizziness, palpitations.no Bilateral lower extremity swelling  GI:  No heartburn, indigestion, abdominal pain, nausea, vomiting, diarrhea, change in bowel habits, loss of appetite, melena, blood in stool, hematemesis Resp:  no shortness of breath at rest. No dyspnea on exertion, No excess mucus, no productive cough, No non-productive cough, No coughing up of blood.No change in color of mucus.No wheezing. Skin:  no rash or lesions. No jaundice GU:  no dysuria, change in color of urine, no urgency or frequency. No straining to urinate.  No flank pain.  Musculoskeletal:  No joint pain or no joint swelling. No decreased range of motion. No back pain.  Psych:  No change in mood or affect. No depression or anxiety. No memory loss.  Neuro: no localizing neurological complaints, no tingling, no weakness, no double vision, no gait abnormality, no slurred speech, no confusion  All systems reviewed and apart from HOPI all are negative  Past Medical History:   Past Medical History:  Diagnosis Date  . Diabetes mellitus without complication (HCC)   . Heart attack (HCC)   . Hypertension      Past Surgical History:  Procedure Laterality Date  . CESAREAN SECTION    . TUBAL LIGATION      Social History:  Ambulatory   independently       reports that she has quit smoking. She has  never used smokeless tobacco. She reports that she does not drink alcohol and does not use drugs.     Family History:   Family History  Problem Relation Age of Onset  . Diabetes Mother   . CVA Mother   . Hypertension Father     Allergies: Allergies  Allergen Reactions  . Penicillins   . Sulfa Antibiotics Rash     Prior to Admission medications   Medication Sig Start Date End Date Taking? Authorizing Provider  amLODipine (NORVASC) 10 MG tablet Take 10 mg by mouth daily.    [provider]  aspirin 81 MG chewable tablet Chew 81 mg by mouth daily.    [provider]  carvedilol (COREG) 12.5 MG tablet Take 12.5 mg by mouth 2 (two) times daily with a meal.    [provider]  gabapentin (NEURONTIN) 100 MG capsule Take 1 capsule (100 mg total) by mouth 3 (three) times daily for 10 days. 07/26/19 08/05/19  08/07/19, MD  glimepiride (AMARYL) 2 MG tablet Take 2 mg by mouth daily with breakfast.    [provider]  metFORMIN (GLUCOPHAGE) 500 MG tablet Take 500 mg by mouth 2 (two) times daily with a meal.    [provider]  oxyCODONE-acetaminophen (PERCOCET) 5-325 MG tablet Take 1-2 tablets by mouth every 6 (six)  hours as needed for moderate pain or severe pain. 07/28/19 07/27/20  Joni Reining, PA-C   Physical Exam: Vitals with BMI 05/28/2020 07/28/2019 07/26/2019  Height 5\' 5"  5\' 5"  -  Weight 260 lbs 9 oz 270 lbs -  BMI 43.36 44.93 -  Systolic 176 165  Diastolic 99 79 89  Pulse 86 65 82     1. General:  in No  Acute distress   Chronically ill -appearing 2. Psychological: Alert and  Oriented 3. Head/ENT:    Dry Mucous Membranes                          Head Non traumatic, neck supple                          Poor Dentition 4. SKIN:   decreased Skin turgor,  Skin clean Dry and intact no rash 5. Heart: Regular rate and rhythm no  Murmur, no Rub or gallop 6. Lungs:  Clear to auscultation bilaterally, no wheezes or crackles    7. Abdomen: Soft, non-tender, Non distended   obese   bowel sounds present 8. Lower extremities: no clubbing, cyanosis, no  edema 9. Neurologically  Strength diminished on the left side   cranial nerves II through XII intact Reports decreased sensation on the left side of the abdomen 10. MSK: Normal range of motion   All other LABS:     Recent Labs  Lab 05/28/20 1730  WBC 6.3  NEUTROABS 2.8  HGB 12.7  HCT 42.5  MCV 66.8*  PLT 196     Recent Labs  Lab 05/28/20 1730  NA 138  K 3.8  CL 100  CO2 27  GLUCOSE 215*  BUN 6  CREATININE 0.86  CALCIUM 9.8     Recent Labs  Lab 05/28/20 1730  AST 18  ALT 14  ALKPHOS 72  BILITOT 0.6  PROT 8.5*  ALBUMIN 4.2       Cultures: No results found for: SDES, SPECREQUEST, CULT, REPTSTATUS   Radiological Exams on Admission: CT Angio Head W or Wo Contrast  Result Date: 05/28/2020 CLINICAL DATA:  Code stroke. 59 year old female with sudden onset left side weakness and heaviness. EXAM: CT HEAD WITHOUT CONTRAST CT ANGIOGRAPHY HEAD AND NECK CT PERFUSION BRAIN TECHNIQUE: Multidetector CT imaging of the head, and CTA head and neck was performed using the standard protocol during bolus administration of intravenous contrast. Multiplanar CT image reconstructions and MIPs were obtained to evaluate the vascular anatomy. Carotid stenosis measurements (when applicable) are obtained utilizing NASCET criteria, using the distal internal carotid diameter as the denominator. Multiphase CT imaging of the brain was performed following IV bolus contrast injection. Subsequent parametric perfusion maps were calculated using RAPID software. CONTRAST:  05/30/2020 OMNIPAQUE IOHEXOL 350 MG/ML SOLN COMPARISON:  Head CT 05/26/2019. FINDINGS: CT HEAD FINDINGS Brain: Cerebral volume is stable and within normal limits for age. No midline shift, ventriculomegaly, mass effect, evidence of mass lesion, intracranial hemorrhage or evidence of cortically based acute infarction.  Gray-white matter differentiation is within normal limits throughout the brain. Chronic partially empty sella. No cortical encephalomalacia identified. Vascular: Calcified atherosclerosis at the skull base. No suspicious intracranial vascular hyperdensity. Skull: No acute osseous abnormality identified. Sinuses/Orbits: Visualized paranasal sinuses and mastoids are stable and well pneumatized. Other: Visualized orbits and scalp soft tissues are within normal limits. ASPECTS [facility] Stroke Program Early CT Score) Total score (0-10 with 10 being  normal): 10 Review of the MIP images confirms the above findings CT Brain Perfusion Findings: ASPECTS: 10 CBF (<30%) Volume: 0mL Perfusion (Tmax>6.0s) volume: 3mL (anterior inferior left temporal lobe, likely artifact) Mismatch Volume: 3mL Infarction Location:Not applicable CTA NECK Skeleton: Absent dentition. Cervical disc and endplate degeneration. No acute osseous abnormality identified. Upper chest: Negative. Other neck: Negative. Aortic arch: 3 vessel arch configuration with mild great vessel origin atherosclerosis. Right carotid system: Soft and calcified plaque in the proximal brachiocephalic artery but no significant stenosis. Negative right CCA origin. Mildly tortuous right CCA. Calcified plaque at the right ICA origin without stenosis. Tortuous vessel distal to the bulb with a kinked appearance (series 9, image 22). Left carotid system: Mild plaque at the left CCA origin without stenosis. Tortuous left CCA. No significant plaque at the left carotid bifurcation or proximal ICA. Tortuous left ICA distal to the bulb similar to the right side with a kinked appearance. Vertebral arteries: Normal proximal right subclavian artery and right vertebral artery origin. The right vertebral artery is patent and within normal limits to the skull base. Mild plaque in the proximal left subclavian artery without stenosis. Normal left vertebral artery origin. Tortuous left V1 segment.  The left vertebral is patent and within normal limits to the skull base. CTA HEAD Posterior circulation: The distal right vertebral artery is mildly dominant especially beyond the left PICA origin. No distal vertebral stenosis. Patent vertebrobasilar junction and basilar artery without stenosis. The right AICA appears dominant. Normal SCA and PCA origins. Posterior communicating arteries are diminutive or absent. Bilateral PCA branches are within normal limits. Anterior circulation: Both ICA siphons are patent. Moderate calcified plaque of the left siphon cavernous segment with associated moderate to severe stenosis in the distal cavernous segment (radiographic string sign series 6, image 93 and series 7, image 105 just proximal to the anterior genu. Supraclinoid left ICA remains patent and within normal limits. Mild to moderate right cavernous ICA calcified plaque and mild to moderate stenosis (series 5, image 109). Patent and negative supraclinoid right ICA. Patent carotid termini, MCA and ACA origins. Anterior communicating artery is diminutive or absent. Bilateral ACA branches are within normal limits. Left MCA M1 segment and bifurcation are patent without stenosis. Right MCA M1 segment and bifurcation are patent without stenosis. Bilateral MCA branches are within normal limits. Venous sinuses: Patent. Anatomic variants: Mildly dominant right vertebral artery. Review of the MIP images confirms the above findings IMPRESSION: 1. Negative for large vessel occlusion. No core infarct detected by CT Perfusion. Stable and negative plain CT appearance of the brain. 2. 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 There is contralateral mild to moderate right ICA siphon plaque and stenosis. 3. But there is very little extracranial atherosclerosis, with no significant vertebral artery or cervical carotid stenosis. Electronically Signed   By: Odessa Fleming M.D.   On:  05/28/2020 18:32   CT Angio Neck W and/or Wo Contrast  Result Date: 05/28/2020 CLINICAL DATA:  Code stroke. 59 year old female with sudden onset left side weakness and heaviness. EXAM: CT HEAD WITHOUT CONTRAST CT ANGIOGRAPHY HEAD AND NECK CT PERFUSION BRAIN TECHNIQUE: Multidetector CT imaging of the head, and CTA head and neck was performed using the standard protocol during bolus administration of intravenous contrast. Multiplanar CT image reconstructions and MIPs were obtained to evaluate the vascular anatomy. Carotid stenosis measurements (when applicable) are obtained utilizing NASCET criteria, using the distal internal carotid diameter as the denominator. Multiphase CT  imaging of the brain was performed following IV bolus contrast injection. Subsequent parametric perfusion maps were calculated using RAPID software. CONTRAST:  OMNIPAQUE IOHEXOL 350 MG/ML SOLN COMPARISON:  Head CT 05/26/2019. FINDINGS: CT HEAD FINDINGS Brain: Cerebral volume is stable and within normal limits for age. No midline shift, ventriculomegaly, mass effect, evidence of mass lesion, intracranial hemorrhage or evidence of cortically based acute infarction. Gray-white matter differentiation is within normal limits throughout the brain. Chronic partially empty sella. No cortical encephalomalacia identified. Vascular: Calcified atherosclerosis at the skull base. No suspicious intracranial vascular hyperdensity. Skull: No acute osseous abnormality identified. Sinuses/Orbits: Visualized paranasal sinuses and mastoids are stable and well pneumatized. Other: Visualized orbits and scalp soft tissues are within normal limits. ASPECTS [facility] Stroke Program Early CT Score) Total score (0-10 with 10 being normal): 10 Review of the MIP images confirms the above findings CT Brain Perfusion Findings: ASPECTS: 10 CBF (<30%) Volume: 0mL Perfusion (Tmax>6.0s) volume: 3mL (anterior inferior left temporal lobe, likely artifact) Mismatch Volume:  3mL Infarction Location:Not applicable CTA NECK Skeleton: Absent dentition. Cervical disc and endplate degeneration. No acute osseous abnormality identified. Upper chest: Negative. Other neck: Negative. Aortic arch: 3 vessel arch configuration with mild great vessel origin atherosclerosis. Right carotid system: Soft and calcified plaque in the proximal brachiocephalic artery but no significant stenosis. Negative right CCA origin. Mildly tortuous right CCA. Calcified plaque at the right ICA origin without stenosis. Tortuous vessel distal to the bulb with a kinked appearance (series 9, image 22). Left carotid system: Mild plaque at the left CCA origin without stenosis. Tortuous left CCA. No significant plaque at the left carotid bifurcation or proximal ICA. Tortuous left ICA distal to the bulb similar to the right side with a kinked appearance. Vertebral arteries: Normal proximal right subclavian artery and right vertebral artery origin. The right vertebral artery is patent and within normal limits to the skull base. Mild plaque in the proximal left subclavian artery without stenosis. Normal left vertebral artery origin. Tortuous left V1 segment. The left vertebral is patent and within normal limits to the skull base. CTA HEAD Posterior circulation: The distal right vertebral artery is mildly dominant especially beyond the left PICA origin. No distal vertebral stenosis. Patent vertebrobasilar junction and basilar artery without stenosis. The right AICA appears dominant. Normal SCA and PCA origins. Posterior communicating arteries are diminutive or absent. Bilateral PCA branches are within normal limits. Anterior circulation: Both ICA siphons are patent. Moderate calcified plaque of the left siphon cavernous segment with associated moderate to severe stenosis in the distal cavernous segment (radiographic string sign series 6, image 93 and series 7, image 105 just proximal to the anterior genu. Supraclinoid left ICA  remains patent and within normal limits. Mild to moderate right cavernous ICA calcified plaque and mild to moderate stenosis (series 5, image 109). Patent and negative supraclinoid right ICA. Patent carotid termini, MCA and ACA origins. Anterior communicating artery is diminutive or absent. Bilateral ACA branches are within normal limits. Left MCA M1 segment and bifurcation are patent without stenosis. Right MCA M1 segment and bifurcation are patent without stenosis. Bilateral MCA branches are within normal limits. Venous sinuses: Patent. Anatomic variants: Mildly dominant right vertebral artery. Review of the MIP images confirms the above findings IMPRESSION: 1. Negative for large vessel occlusion. No core infarct detected by CT Perfusion. Stable and negative plain CT appearance of the brain. 2. 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 There is contralateral mild  to moderate right ICA siphon plaque and stenosis. 3. But there is very little extracranial atherosclerosis, with no significant vertebral artery or cervical carotid stenosis. Electronically Signed   By: Odessa Fleming M.D.   On: 05/28/2020 18:32   CT CEREBRAL PERFUSION W CONTRAST  Result Date: 05/28/2020 CLINICAL DATA:  Code stroke. 59 year old female with sudden onset left side weakness and heaviness. EXAM: CT HEAD WITHOUT CONTRAST CT ANGIOGRAPHY HEAD AND NECK CT PERFUSION BRAIN TECHNIQUE: Multidetector CT imaging of the head, and CTA head and neck was performed using the standard protocol during bolus administration of intravenous contrast. Multiplanar CT image reconstructions and MIPs were obtained to evaluate the vascular anatomy. Carotid stenosis measurements (when applicable) are obtained utilizing NASCET criteria, using the distal internal carotid diameter as the denominator. Multiphase CT imaging of the brain was performed following IV bolus contrast injection. Subsequent parametric  perfusion maps were calculated using RAPID software. CONTRAST:  OMNIPAQUE IOHEXOL 350 MG/ML SOLN COMPARISON:  Head CT 05/26/2019. FINDINGS: CT HEAD FINDINGS Brain: Cerebral volume is stable and within normal limits for age. No midline shift, ventriculomegaly, mass effect, evidence of mass lesion, intracranial hemorrhage or evidence of cortically based acute infarction. Gray-white matter differentiation is within normal limits throughout the brain. Chronic partially empty sella. No cortical encephalomalacia identified. Vascular: Calcified atherosclerosis at the skull base. No suspicious intracranial vascular hyperdensity. Skull: No acute osseous abnormality identified. Sinuses/Orbits: Visualized paranasal sinuses and mastoids are stable and well pneumatized. Other: Visualized orbits and scalp soft tissues are within normal limits. ASPECTS [facility] Stroke Program Early CT Score) Total score (0-10 with 10 being normal): 10 Review of the MIP images confirms the above findings CT Brain Perfusion Findings: ASPECTS: 10 CBF (<30%) Volume: 0mL Perfusion (Tmax>6.0s) volume: 3mL (anterior inferior left temporal lobe, likely artifact) Mismatch Volume: 3mL Infarction Location:Not applicable CTA NECK Skeleton: Absent dentition. Cervical disc and endplate degeneration. No acute osseous abnormality identified. Upper chest: Negative. Other neck: Negative. Aortic arch: 3 vessel arch configuration with mild great vessel origin atherosclerosis. Right carotid system: Soft and calcified plaque in the proximal brachiocephalic artery but no significant stenosis. Negative right CCA origin. Mildly tortuous right CCA. Calcified plaque at the right ICA origin without stenosis. Tortuous vessel distal to the bulb with a kinked appearance (series 9, image 22). Left carotid system: Mild plaque at the left CCA origin without stenosis. Tortuous left CCA. No significant plaque at the left carotid bifurcation or proximal ICA. Tortuous left ICA  distal to the bulb similar to the right side with a kinked appearance. Vertebral arteries: Normal proximal right subclavian artery and right vertebral artery origin. The right vertebral artery is patent and within normal limits to the skull base. Mild plaque in the proximal left subclavian artery without stenosis. Normal left vertebral artery origin. Tortuous left V1 segment. The left vertebral is patent and within normal limits to the skull base. CTA HEAD Posterior circulation: The distal right vertebral artery is mildly dominant especially beyond the left PICA origin. No distal vertebral stenosis. Patent vertebrobasilar junction and basilar artery without stenosis. The right AICA appears dominant. Normal SCA and PCA origins. Posterior communicating arteries are diminutive or absent. Bilateral PCA branches are within normal limits. Anterior circulation: Both ICA siphons are patent. Moderate calcified plaque of the left siphon cavernous segment with associated moderate to severe stenosis in the distal cavernous segment (radiographic string sign series 6, image 93 and series 7, image 105 just proximal to the anterior genu. Supraclinoid left ICA remains patent and within  normal limits. Mild to moderate right cavernous ICA calcified plaque and mild to moderate stenosis (series 5, image 109). Patent and negative supraclinoid right ICA. Patent carotid termini, MCA and ACA origins. Anterior communicating artery is diminutive or absent. Bilateral ACA branches are within normal limits. Left MCA M1 segment and bifurcation are patent without stenosis. Right MCA M1 segment and bifurcation are patent without stenosis. Bilateral MCA branches are within normal limits. Venous sinuses: Patent. Anatomic variants: Mildly dominant right vertebral artery. Review of the MIP images confirms the above findings IMPRESSION: 1. Negative for large vessel occlusion. No core infarct detected by CT Perfusion. Stable and negative plain CT  appearance of the brain. 2. 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 There is contralateral mild to moderate right ICA siphon plaque and stenosis. 3. But there is very little extracranial atherosclerosis, with no significant vertebral artery or cervical carotid stenosis. Electronically Signed   By: Odessa Fleming M.D.   On: 05/28/2020 18:32   CT HEAD CODE STROKE WO CONTRAST  Result Date: 05/28/2020 CLINICAL DATA:  Code stroke. 59 year old female with sudden onset left side weakness and heaviness. EXAM: CT HEAD WITHOUT CONTRAST CT ANGIOGRAPHY HEAD AND NECK CT PERFUSION BRAIN TECHNIQUE: Multidetector CT imaging of the head, and CTA head and neck was performed using the standard protocol during bolus administration of intravenous contrast. Multiplanar CT image reconstructions and MIPs were obtained to evaluate the vascular anatomy. Carotid stenosis measurements (when applicable) are obtained utilizing NASCET criteria, using the distal internal carotid diameter as the denominator. Multiphase CT imaging of the brain was performed following IV bolus contrast injection. Subsequent parametric perfusion maps were calculated using RAPID software. CONTRAST:  OMNIPAQUE IOHEXOL 350 MG/ML SOLN COMPARISON:  Head CT 05/26/2019. FINDINGS: CT HEAD FINDINGS Brain: Cerebral volume is stable and within normal limits for age. No midline shift, ventriculomegaly, mass effect, evidence of mass lesion, intracranial hemorrhage or evidence of cortically based acute infarction. Gray-white matter differentiation is within normal limits throughout the brain. Chronic partially empty sella. No cortical encephalomalacia identified. Vascular: Calcified atherosclerosis at the skull base. No suspicious intracranial vascular hyperdensity. Skull: No acute osseous abnormality identified. Sinuses/Orbits: Visualized paranasal sinuses and mastoids are stable and well pneumatized. Other:  Visualized orbits and scalp soft tissues are within normal limits. ASPECTS [facility] Stroke Program Early CT Score) Total score (0-10 with 10 being normal): 10 Review of the MIP images confirms the above findings CT Brain Perfusion Findings: ASPECTS: 10 CBF (<30%) Volume: 0mL Perfusion (Tmax>6.0s) volume: 3mL (anterior inferior left temporal lobe, likely artifact) Mismatch Volume: 3mL Infarction Location:Not applicable CTA NECK Skeleton: Absent dentition. Cervical disc and endplate degeneration. No acute osseous abnormality identified. Upper chest: Negative. Other neck: Negative. Aortic arch: 3 vessel arch configuration with mild great vessel origin atherosclerosis. Right carotid system: Soft and calcified plaque in the proximal brachiocephalic artery but no significant stenosis. Negative right CCA origin. Mildly tortuous right CCA. Calcified plaque at the right ICA origin without stenosis. Tortuous vessel distal to the bulb with a kinked appearance (series 9, image 22). Left carotid system: Mild plaque at the left CCA origin without stenosis. Tortuous left CCA. No significant plaque at the left carotid bifurcation or proximal ICA. Tortuous left ICA distal to the bulb similar to the right side with a kinked appearance. Vertebral arteries: Normal proximal right subclavian artery and right vertebral artery origin. The right vertebral artery is patent and within normal limits to the skull base. Mild  plaque in the proximal left subclavian artery without stenosis. Normal left vertebral artery origin. Tortuous left V1 segment. The left vertebral is patent and within normal limits to the skull base. CTA HEAD Posterior circulation: The distal right vertebral artery is mildly dominant especially beyond the left PICA origin. No distal vertebral stenosis. Patent vertebrobasilar junction and basilar artery without stenosis. The right AICA appears dominant. Normal SCA and PCA origins. Posterior communicating arteries are  diminutive or absent. Bilateral PCA branches are within normal limits. Anterior circulation: Both ICA siphons are patent. Moderate calcified plaque of the left siphon cavernous segment with associated moderate to severe stenosis in the distal cavernous segment (radiographic string sign series 6, image 93 and series 7, image 105 just proximal to the anterior genu. Supraclinoid left ICA remains patent and within normal limits. Mild to moderate right cavernous ICA calcified plaque and mild to moderate stenosis (series 5, image 109). Patent and negative supraclinoid right ICA. Patent carotid termini, MCA and ACA origins. Anterior communicating artery is diminutive or absent. Bilateral ACA branches are within normal limits. Left MCA M1 segment and bifurcation are patent without stenosis. Right MCA M1 segment and bifurcation are patent without stenosis. Bilateral MCA branches are within normal limits. Venous sinuses: Patent. Anatomic variants: Mildly dominant right vertebral artery. Review of the MIP images confirms the above findings IMPRESSION: 1. Negative for large vessel occlusion. No core infarct detected by CT Perfusion. Stable and negative plain CT appearance of the brain. 2. 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 There is contralateral mild to moderate right ICA siphon plaque and stenosis. 3. But there is very little extracranial atherosclerosis, with no significant vertebral artery or cervical carotid stenosis. Electronically Signed   By: Odessa FlemingH  Hall M.D.   On: 05/28/2020 18:32    Chart has been reviewed    Assessment/Plan  59 y.o. female with medical history significant of migraines, DM2, HTN Cervical radiculopathy, CAD  Admitted for stroke like symptoms Present on Admission: . Stroke-like symptoms  -  - will admit based on TIA/CVA protocol,       Monitor on Tele       MRA/MRI await results        CTA showing intracranial stenosis Will  need non emergent vascular surgery consult         Echo to evaluate for possible embolic source,        obtain cardiac enzymes,  ECG,   Lipid panel, TSH.        Order PT/OT evaluation.               Will make sure patient is on antiplatelet ASA   325  and statin if needed       Allow permissive Hypertension keep BP <220/120        Neurology consulted    . Essential hypertension - allow permissive HTN for tonight  . Obesity, Class III, BMI 40-49.9 (morbid obesity) (HCC) - will need outpatient follow up up with nutritionist  DM2 -  - Order Sensitive  SSI   -  check TSH and HgA1C  - Hold by mouth medications   Other plan as per orders.  DVT prophylaxis:  SCD      Code Status:    Code Status: Not on file FULL CODE   as per patient   I had personally discussed CODE STATUS with patient      Family Communication:   Family not  at  Bedside    Disposition Plan:    To home once workup is complete and patient is stable vs may need rehab if persistent symptoms   Following barriers for discharge:                                                      Will likely need home health,                             Will need consultants to evaluate patient prior to discharge                       Would benefit from PT/OT eval prior to DC  Ordered                   Swallow eval - SLP ordered                   Diabetes care coordinator                    Consults called: Neurology is aware    Admission status:  ED Disposition    ED Disposition Condition Comment   Admit  Hospital Area: [facility] [[id number]]  Level of Care: Med-Surg [16]  Covid Evaluation: Asymptomatic Screening Protocol (No Symptoms)  Diagnosis: Stroke-like symptoms [161096]  Admitting Physician: Therisa Doyne [[id number]]  Attending Physician: Therisa Doyne [[id number]]        Obs  Level of care    Tele indefinitely please discontinue once patient no longer qualifies COVID-19 Labs   No results  found for: [id number]   Precautions: admitted as  asymptomatic screening protocol    PPE: Used by the provider:   P100  eye Goggles,  Gloves      Maurine Mowbray 05/28/2020, 7:47 PM    [facility]     after 2 AM please page floor coverage PA If 7AM-7PM, please contact the day team taking care of the patient using [email or web address]   Patient was evaluated in the context of the global COVID-19 pandemic, which necessitated consideration that the patient might be at risk for infection with the SARS-CoV-2 virus that causes COVID-19. Institutional protocols and algorithms that pertain to the evaluation of patients at risk for COVID-19 are in a state of rapid change based on information released by regulatory bodies including the CDC and federal and state organizations. These policies and algorithms were followed during the patient's care.

## 2020-05-28 NOTE — ED Triage Notes (Signed)
Pt presents w/ sudden onset L sided heaviness. Pt is dragging L foot when ambulating which is new. Pt's CSS is otherwise negative for stroke. Pt c/o abdominal pressure on L side at same time she experienced L sided weakness. Pt c/o sharp pain in head when waking at 0300 this morning and has persistent headache since. Pt took robaxin and gabapentin at 0300 after headache and L sided weakness began w/o relief.

## 2020-05-29 ENCOUNTER — Encounter: Payer: Self-pay | Admitting: Internal Medicine

## 2020-05-29 ENCOUNTER — Observation Stay: Payer: Self-pay

## 2020-05-29 ENCOUNTER — Inpatient Hospital Stay: Payer: Self-pay

## 2020-05-29 DIAGNOSIS — I700 Atherosclerosis of aorta: Secondary | ICD-10-CM

## 2020-05-29 DIAGNOSIS — E119 Type 2 diabetes mellitus without complications: Secondary | ICD-10-CM

## 2020-05-29 DIAGNOSIS — I472 Ventricular tachycardia: Secondary | ICD-10-CM

## 2020-05-29 DIAGNOSIS — R29898 Other symptoms and signs involving the musculoskeletal system: Secondary | ICD-10-CM

## 2020-05-29 DIAGNOSIS — I739 Peripheral vascular disease, unspecified: Secondary | ICD-10-CM

## 2020-05-29 DIAGNOSIS — R519 Headache, unspecified: Secondary | ICD-10-CM

## 2020-05-29 LAB — CBC
HCT: 39.9 % (ref 36.0–46.0)
Hemoglobin: 11.8 g/dL — ABNORMAL LOW (ref 12.0–15.0)
MCH: 19.9 pg — ABNORMAL LOW (ref 26.0–34.0)
MCHC: 29.6 g/dL — ABNORMAL LOW (ref 30.0–36.0)
MCV: 67.4 fL — ABNORMAL LOW (ref 80.0–100.0)
Platelets: 178 10*3/uL (ref 150–400)
RBC: 5.92 MIL/uL — ABNORMAL HIGH (ref 3.87–5.11)
RDW: 17.2 % — ABNORMAL HIGH (ref 11.5–15.5)
WBC: 5.6 10*3/uL (ref 4.0–10.5)
nRBC: 0 % (ref 0.0–0.2)

## 2020-05-29 LAB — RESPIRATORY PANEL BY RT PCR (FLU A&B, COVID)
Influenza A by PCR: NEGATIVE
Influenza B by PCR: NEGATIVE
SARS Coronavirus 2 by RT PCR: NEGATIVE

## 2020-05-29 LAB — LIPID PANEL
Cholesterol: 178 mg/dL (ref 0–200)
HDL: 37 mg/dL — ABNORMAL LOW (ref >40)
LDL Cholesterol: 107 mg/dL — ABNORMAL HIGH (ref 0–99)
Total CHOL/HDL Ratio: 4.8 RATIO
Triglycerides: 170 mg/dL — ABNORMAL HIGH (ref <150)
VLDL: 34 mg/dL (ref 0–40)

## 2020-05-29 LAB — GLUCOSE, CAPILLARY
Glucose-Capillary: 224 mg/dL — ABNORMAL HIGH (ref 70–99)
Glucose-Capillary: 222 mg/dL — ABNORMAL HIGH (ref 70–99)
Glucose-Capillary: 242 mg/dL — ABNORMAL HIGH (ref 70–99)
Glucose-Capillary: 187 mg/dL — ABNORMAL HIGH (ref 70–99)
Glucose-Capillary: 206 mg/dL — ABNORMAL HIGH (ref 70–99)
Glucose-Capillary: 239 mg/dL — ABNORMAL HIGH (ref 70–99)

## 2020-05-29 LAB — HIV ANTIBODY (ROUTINE TESTING W REFLEX): HIV Screen 4th Generation wRfx: NONREACTIVE

## 2020-05-29 LAB — CREATININE, SERUM
Creatinine, Ser: 1.07 mg/dL — ABNORMAL HIGH (ref 0.44–1.00)
GFR, Estimated: 58 mL/min — ABNORMAL LOW (ref >60)

## 2020-05-29 MED ORDER — ACETAMINOPHEN 650 MG RE SUPP: 650.0000 mg | RECTAL | Status: DC | PRN

## 2020-05-29 MED ORDER — GABAPENTIN 100 MG PO CAPS
100.0000 mg | ORAL_CAPSULE | Freq: Three times a day (TID) | ORAL | Status: DC
  Administered 2020-05-29 (×2): 100 mg via ORAL
  Filled 2020-05-29 (×3): qty 1

## 2020-05-29 MED ORDER — ACETAMINOPHEN 160 MG/5ML PO SOLN
650.0000 mg | ORAL | Status: DC | PRN
  Filled 2020-05-29: qty 20.3

## 2020-05-29 MED ORDER — SODIUM CHLORIDE 0.9 % IV SOLN: INTRAVENOUS | Status: DC

## 2020-05-29 MED ORDER — ACETAMINOPHEN 325 MG PO TABS: 650.0000 mg | ORAL_TABLET | ORAL | Status: DC | PRN

## 2020-05-29 MED ORDER — ATORVASTATIN CALCIUM 20 MG PO TABS
40.0000 mg | ORAL_TABLET | Freq: Every day | ORAL | Status: DC
  Administered 2020-05-29 – 2020-05-30 (×2): 40 mg via ORAL
  Filled 2020-05-29 (×2): qty 2

## 2020-05-29 MED ORDER — ASPIRIN 325 MG PO TABS
325.0000 mg | ORAL_TABLET | Freq: Every day | ORAL | Status: DC
  Administered 2020-05-29 – 2020-05-30 (×2): 325 mg via ORAL
  Filled 2020-05-29 (×2): qty 1

## 2020-05-29 MED ORDER — KETOROLAC TROMETHAMINE 30 MG/ML IJ SOLN
15.0000 mg | INTRAMUSCULAR | Status: AC
  Administered 2020-05-29: 15 mg via INTRAVENOUS
  Filled 2020-05-29: qty 1

## 2020-05-29 MED ORDER — ENOXAPARIN SODIUM 40 MG/0.4ML ~~LOC~~ SOLN
40.0000 mg | SUBCUTANEOUS | Status: DC
  Administered 2020-05-29: 40 mg via SUBCUTANEOUS
  Filled 2020-05-29 (×2): qty 0.4

## 2020-05-29 MED ORDER — AMLODIPINE BESYLATE 5 MG PO TABS
5.0000 mg | ORAL_TABLET | Freq: Every day | ORAL | Status: DC
  Administered 2020-05-29 – 2020-05-30 (×2): 5 mg via ORAL
  Filled 2020-05-29 (×2): qty 1

## 2020-05-29 MED ORDER — ASPIRIN 300 MG RE SUPP
300.0000 mg | Freq: Every day | RECTAL | Status: DC
  Filled 2020-05-29 (×2): qty 1

## 2020-05-29 MED ORDER — STROKE: EARLY STAGES OF RECOVERY BOOK: Freq: Once | Status: AC

## 2020-05-29 NOTE — Progress Notes (Signed)
[facility]   Reviewed images with Dr. Wyn Quaker No significant cervical disease (mild) however intracranial disease noted Recommend medical management Happy to follow mild diease as outpatient - see in six months  Patient not in room at time of visit. Full consult to follow.  Cleda Daub PA-C 05/29/2020 1:37 PM

## 2020-05-29 NOTE — Progress Notes (Signed)
Pt transferred from the ED in stable condition.  VS taken.  Pt is A/O.  Pt oriented to room and unit.  Pt instructed on use of call bell.  Bed in lowest position and wheels locked.

## 2020-05-29 NOTE — ED Notes (Signed)
Pt resting comfortably at this time. Pt requests coffee and warm blankets, both provided. Pt also states headache is currently 7/10 and would like something for pain, MD aware, new orders, see Mar.

## 2020-05-29 NOTE — Progress Notes (Addendum)
Subjective: Patient reports continued headache and left sided heaviness although symptoms most prominent now in the LLE and left side of the abdomen.    Objective: Current vital signs: BP (!) 146/84   Pulse 71   Temp 98.6 F (37 C) (Oral)   Resp 12   Ht 5\' 5"  (1.651 m)   Wt 118.2 kg   SpO2 98%   BMI 43.36 kg/m  Vital signs in last 24 hours: Temp:  [98.6 F (37 C)] 98.6 F (37 C) (10/13 1717) Pulse Rate:  [63-86] 71 (10/14 0920) Resp:  [12-18] 12 (10/13 2300) BP: (121-176)/(61-99) 146/84 (10/14 0920) SpO2:  [95 %-100 %] 98 % (10/14 0922) Weight:  [118.2 kg] 118.2 kg (10/13 1718)  Intake/Output from previous day: No intake/output data recorded. Intake/Output this shift: No intake/output data recorded. Nutritional status:  Diet Order            Diet Carb Modified Fluid consistency: Thin; Room service appropriate? Yes  Diet effective now                 Neurologic Exam: Mental Status: Alert, oriented, thought content appropriate.  Speech fluent with some word finding difficulties at times.  Able to follow 3 step commands without difficulty. Cranial Nerves: II: Visual fields grossly normal, pupils equal, round, reactive to light and accommodation III,IV, VI: ptosis not present, extra-ocular motions intact bilaterally V,VII: smile symmetric, facial light touch sensation normal bilaterally VIII: hearing normal bilaterally IX,X: gag reflex present XI: bilateral shoulder shrug XII: midline tongue extension Motor: Right :  Upper extremity   5/5                                      Left:     Upper extremity   5/5             Lower extremity   4+/5                                                            Lower extremity   4+/5 Tone and bulk:normal tone throughout; no atrophy noted Sensory: Pinprick and light touch intact throughout, bilaterally Deep Tendon Reflexes: Symmetric throughout Plantars: Right: mute                              Left: mute Cerebellar: Normal  finger-to-nose and normal heel-to-shin testing bilaterally Gait: not tested due to safety concerns     Lab Results: Basic Metabolic Panel: Recent Labs  Lab 05/28/20 1730  NA 138  K 3.8  CL 100  CO2 27  GLUCOSE 215*  BUN 6  CREATININE 0.86  CALCIUM 9.8    Liver Function Tests: Recent Labs  Lab 05/28/20 1730  AST 18  ALT 14  ALKPHOS 72  BILITOT 0.6  PROT 8.5*  ALBUMIN 4.2   No results for input(s): LIPASE, AMYLASE in the last 168 hours. No results for input(s): AMMONIA in the last 168 hours.  CBC: Recent Labs  Lab 05/28/20 1730  WBC 6.3  NEUTROABS 2.8  HGB 12.7  HCT 42.5  MCV 66.8*  PLT 196    Cardiac Enzymes: No results for input(s): CKTOTAL, CKMB,  CKMBINDEX, TROPONINI in the last 168 hours.  Lipid Panel: No results for input(s): CHOL, TRIG, HDL, CHOLHDL, VLDL, LDLCALC in the last 168 hours.  CBG: Recent Labs  Lab 05/28/20 1813 05/28/20 2202 05/29/20 0307 05/29/20 0809  GLUCAP 198* 197* 224* 222*    Microbiology: Results for orders placed or performed during the hospital encounter of 05/28/20  Respiratory Panel by RT PCR (Flu A&B, Covid) - Nasopharyngeal Swab     Status: None   Collection Time: 05/29/20  3:10 AM   Specimen: Nasopharyngeal Swab  Result Value Ref Range Status   SARS Coronavirus 2 by RT PCR NEGATIVE NEGATIVE Final    Comment: (NOTE) SARS-CoV-2 target nucleic acids are NOT DETECTED.  The SARS-CoV-2 RNA is generally detectable in upper respiratoy specimens during the acute phase of infection. The lowest concentration of SARS-CoV-2 viral copies this assay can detect is 131 copies/mL. A negative result does not preclude SARS-Cov-2 infection and should not be used as the sole basis for treatment or other patient management decisions. A negative result may occur with  improper specimen collection/handling, submission of specimen other than nasopharyngeal swab, presence of viral mutation(s) within the areas targeted by this assay,  and inadequate number of viral copies (<131 copies/mL). A negative result must be combined with clinical observations, patient history, and epidemiological information. The expected result is Negative.  Fact Sheet for Patients:  PinkCheek.be  Fact Sheet for Healthcare Providers:  GravelBags.it  This test is no t yet approved or cleared by the [place] FDA and  has been authorized for detection and/or diagnosis of SARS-CoV-2 by FDA under an Emergency Use Authorization (EUA). This EUA will remain  in effect (meaning this test can be used) for the duration of the COVID-19 declaration under Section 564(b)(1) of the Act, 21 U.S.C. section 360bbb-3(b)(1), unless the authorization is terminated or revoked sooner.     Influenza A by PCR NEGATIVE NEGATIVE Final   Influenza B by PCR NEGATIVE NEGATIVE Final    Comment: (NOTE) The Xpert Xpress SARS-CoV-2/FLU/RSV assay is intended as an aid in  the diagnosis of influenza from Nasopharyngeal swab specimens and  should not be used as a sole basis for treatment. Nasal washings and  aspirates are unacceptable for Xpert Xpress SARS-CoV-2/FLU/RSV  testing.  Fact Sheet for Patients: PinkCheek.be  Fact Sheet for Healthcare Providers: GravelBags.it  This test is not yet approved or cleared by the [place] FDA and  has been authorized for detection and/or diagnosis of SARS-CoV-2 by  FDA under an Emergency Use Authorization (EUA). This EUA will remain  in effect (meaning this test can be used) for the duration of the  Covid-19 declaration under Section 564(b)(1) of the Act, 21  U.S.C. section 360bbb-3(b)(1), unless the authorization is  terminated or revoked. Performed at [facility], [address], [place], [place] [address]     Coagulation Studies: Recent Labs    05/28/20 1730  LABPROT 12.8  INR 1.0     Imaging: CT Angio Head W or Wo Contrast  Addendum Date: 05/28/2020   ADDENDUM REPORT: 05/28/2020 18:58 ADDENDUM: Study discussed by telephone with Dr. Jacqualine Code on 05/28/2020 at 1837 hours. Electronically Signed   By: Genevie Ann M.D.   On: 05/28/2020 18:58   Result Date: 05/28/2020 CLINICAL DATA:  Code stroke. 59 year old female with sudden onset left side weakness and heaviness. EXAM: CT HEAD WITHOUT CONTRAST CT ANGIOGRAPHY HEAD AND NECK CT PERFUSION BRAIN TECHNIQUE: Multidetector CT imaging of the head, and CTA head and  neck was performed using the standard protocol during bolus administration of intravenous contrast. Multiplanar CT image reconstructions and MIPs were obtained to evaluate the vascular anatomy. Carotid stenosis measurements (when applicable) are obtained utilizing NASCET criteria, using the distal internal carotid diameter as the denominator. Multiphase CT imaging of the brain was performed following IV bolus contrast injection. Subsequent parametric perfusion maps were calculated using RAPID software. CONTRAST:  123mL OMNIPAQUE IOHEXOL 350 MG/ML SOLN COMPARISON:  Head CT 05/26/2019. FINDINGS: CT HEAD FINDINGS Brain: Cerebral volume is stable and within normal limits for age. No midline shift, ventriculomegaly, mass effect, evidence of mass lesion, intracranial hemorrhage or evidence of cortically based acute infarction. Gray-white matter differentiation is within normal limits throughout the brain. Chronic partially empty sella. No cortical encephalomalacia identified. Vascular: Calcified atherosclerosis at the skull base. No suspicious intracranial vascular hyperdensity. Skull: No acute osseous abnormality identified. Sinuses/Orbits: Visualized paranasal sinuses and mastoids are stable and well pneumatized. Other: Visualized orbits and scalp soft tissues are within normal limits. ASPECTS [facility] Stroke Program Early CT Score) Total score (0-10 with 10 being normal): 10 Review of the MIP  images confirms the above findings CT Brain Perfusion Findings: ASPECTS: 10 CBF (<30%) Volume: 23mL Perfusion (Tmax>6.0s) volume: 40mL (anterior inferior left temporal lobe, likely artifact) Mismatch Volume: 40mL Infarction Location:Not applicable CTA NECK Skeleton: Absent dentition. Cervical disc and endplate degeneration. No acute osseous abnormality identified. Upper chest: Negative. Other neck: Negative. Aortic arch: 3 vessel arch configuration with mild great vessel origin atherosclerosis. Right carotid system: Soft and calcified plaque in the proximal brachiocephalic artery but no significant stenosis. Negative right CCA origin. Mildly tortuous right CCA. Calcified plaque at the right ICA origin without stenosis. Tortuous vessel distal to the bulb with a kinked appearance (series 9, image 22). Left carotid system: Mild plaque at the left CCA origin without stenosis. Tortuous left CCA. No significant plaque at the left carotid bifurcation or proximal ICA. Tortuous left ICA distal to the bulb similar to the right side with a kinked appearance. Vertebral arteries: Normal proximal right subclavian artery and right vertebral artery origin. The right vertebral artery is patent and within normal limits to the skull base. Mild plaque in the proximal left subclavian artery without stenosis. Normal left vertebral artery origin. Tortuous left V1 segment. The left vertebral is patent and within normal limits to the skull base. CTA HEAD Posterior circulation: The distal right vertebral artery is mildly dominant especially beyond the left PICA origin. No distal vertebral stenosis. Patent vertebrobasilar junction and basilar artery without stenosis. The right AICA appears dominant. Normal SCA and PCA origins. Posterior communicating arteries are diminutive or absent. Bilateral PCA branches are within normal limits. Anterior circulation: Both ICA siphons are patent. Moderate calcified plaque of the left siphon cavernous segment  with associated moderate to severe stenosis in the distal cavernous segment (radiographic string sign series 6, image 93 and series 7, image 105 just proximal to the anterior genu. Supraclinoid left ICA remains patent and within normal limits. Mild to moderate right cavernous ICA calcified plaque and mild to moderate stenosis (series 5, image 109). Patent and negative supraclinoid right ICA. Patent carotid termini, MCA and ACA origins. Anterior communicating artery is diminutive or absent. Bilateral ACA branches are within normal limits. Left MCA M1 segment and bifurcation are patent without stenosis. Right MCA M1 segment and bifurcation are patent without stenosis. Bilateral MCA branches are within normal limits. Venous sinuses: Patent. Anatomic variants: Mildly dominant right vertebral artery. Review of the MIP images confirms  the above findings IMPRESSION: 1. Negative for large vessel occlusion. No core infarct detected by CT Perfusion. Stable and negative plain CT appearance of the brain. 2. 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 There is contralateral mild to moderate right ICA siphon plaque and stenosis. 3. But there is very little extracranial atherosclerosis, with no significant vertebral artery or cervical carotid stenosis. Electronically Signed: By: Genevie Ann M.D. On: 05/28/2020 18:32   DG Chest 2 View  Result Date: 05/28/2020 CLINICAL DATA:  Left-sided weakness and headache.  Hypertension EXAM: CHEST - 2 VIEW COMPARISON:  CT angiogram chest July 26, 2019 FINDINGS: Lungs are clear. There is cardiomegaly with pulmonary vascularity normal. No adenopathy. No bone lesions. IMPRESSION: Cardiomegaly.  Lungs clear.  No adenopathy evident. Electronically Signed   By: Lowella Grip III M.D.   On: 05/28/2020 19:27   CT Angio Neck W and/or Wo Contrast  Addendum Date: 05/28/2020   ADDENDUM REPORT: 05/28/2020 18:58 ADDENDUM: Study  discussed by telephone with Dr. Jacqualine Code on 05/28/2020 at 1837 hours. Electronically Signed   By: Genevie Ann M.D.   On: 05/28/2020 18:58   Result Date: 05/28/2020 CLINICAL DATA:  Code stroke. 59 year old female with sudden onset left side weakness and heaviness. EXAM: CT HEAD WITHOUT CONTRAST CT ANGIOGRAPHY HEAD AND NECK CT PERFUSION BRAIN TECHNIQUE: Multidetector CT imaging of the head, and CTA head and neck was performed using the standard protocol during bolus administration of intravenous contrast. Multiplanar CT image reconstructions and MIPs were obtained to evaluate the vascular anatomy. Carotid stenosis measurements (when applicable) are obtained utilizing NASCET criteria, using the distal internal carotid diameter as the denominator. Multiphase CT imaging of the brain was performed following IV bolus contrast injection. Subsequent parametric perfusion maps were calculated using RAPID software. CONTRAST:  164mL OMNIPAQUE IOHEXOL 350 MG/ML SOLN COMPARISON:  Head CT 05/26/2019. FINDINGS: CT HEAD FINDINGS Brain: Cerebral volume is stable and within normal limits for age. No midline shift, ventriculomegaly, mass effect, evidence of mass lesion, intracranial hemorrhage or evidence of cortically based acute infarction. Gray-white matter differentiation is within normal limits throughout the brain. Chronic partially empty sella. No cortical encephalomalacia identified. Vascular: Calcified atherosclerosis at the skull base. No suspicious intracranial vascular hyperdensity. Skull: No acute osseous abnormality identified. Sinuses/Orbits: Visualized paranasal sinuses and mastoids are stable and well pneumatized. Other: Visualized orbits and scalp soft tissues are within normal limits. ASPECTS [facility] Stroke Program Early CT Score) Total score (0-10 with 10 being normal): 10 Review of the MIP images confirms the above findings CT Brain Perfusion Findings: ASPECTS: 10 CBF (<30%) Volume: 21mL Perfusion (Tmax>6.0s) volume:  75mL (anterior inferior left temporal lobe, likely artifact) Mismatch Volume: 27mL Infarction Location:Not applicable CTA NECK Skeleton: Absent dentition. Cervical disc and endplate degeneration. No acute osseous abnormality identified. Upper chest: Negative. Other neck: Negative. Aortic arch: 3 vessel arch configuration with mild great vessel origin atherosclerosis. Right carotid system: Soft and calcified plaque in the proximal brachiocephalic artery but no significant stenosis. Negative right CCA origin. Mildly tortuous right CCA. Calcified plaque at the right ICA origin without stenosis. Tortuous vessel distal to the bulb with a kinked appearance (series 9, image 22). Left carotid system: Mild plaque at the left CCA origin without stenosis. Tortuous left CCA. No significant plaque at the left carotid bifurcation or proximal ICA. Tortuous left ICA distal to the bulb similar to the right side with a kinked appearance. Vertebral arteries: Normal proximal right subclavian artery and right  vertebral artery origin. The right vertebral artery is patent and within normal limits to the skull base. Mild plaque in the proximal left subclavian artery without stenosis. Normal left vertebral artery origin. Tortuous left V1 segment. The left vertebral is patent and within normal limits to the skull base. CTA HEAD Posterior circulation: The distal right vertebral artery is mildly dominant especially beyond the left PICA origin. No distal vertebral stenosis. Patent vertebrobasilar junction and basilar artery without stenosis. The right AICA appears dominant. Normal SCA and PCA origins. Posterior communicating arteries are diminutive or absent. Bilateral PCA branches are within normal limits. Anterior circulation: Both ICA siphons are patent. Moderate calcified plaque of the left siphon cavernous segment with associated moderate to severe stenosis in the distal cavernous segment (radiographic string sign series 6, image 93 and  series 7, image 105 just proximal to the anterior genu. Supraclinoid left ICA remains patent and within normal limits. Mild to moderate right cavernous ICA calcified plaque and mild to moderate stenosis (series 5, image 109). Patent and negative supraclinoid right ICA. Patent carotid termini, MCA and ACA origins. Anterior communicating artery is diminutive or absent. Bilateral ACA branches are within normal limits. Left MCA M1 segment and bifurcation are patent without stenosis. Right MCA M1 segment and bifurcation are patent without stenosis. Bilateral MCA branches are within normal limits. Venous sinuses: Patent. Anatomic variants: Mildly dominant right vertebral artery. Review of the MIP images confirms the above findings IMPRESSION: 1. Negative for large vessel occlusion. No core infarct detected by CT Perfusion. Stable and negative plain CT appearance of the brain. 2. 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 There is contralateral mild to moderate right ICA siphon plaque and stenosis. 3. But there is very little extracranial atherosclerosis, with no significant vertebral artery or cervical carotid stenosis. Electronically Signed: By: Genevie Ann M.D. On: 05/28/2020 18:32   CT CEREBRAL PERFUSION W CONTRAST  Addendum Date: 05/28/2020   ADDENDUM REPORT: 05/28/2020 18:58 ADDENDUM: Study discussed by telephone with Dr. Jacqualine Code on 05/28/2020 at 1837 hours. Electronically Signed   By: Genevie Ann M.D.   On: 05/28/2020 18:58   Result Date: 05/28/2020 CLINICAL DATA:  Code stroke. 59 year old female with sudden onset left side weakness and heaviness. EXAM: CT HEAD WITHOUT CONTRAST CT ANGIOGRAPHY HEAD AND NECK CT PERFUSION BRAIN TECHNIQUE: Multidetector CT imaging of the head, and CTA head and neck was performed using the standard protocol during bolus administration of intravenous contrast. Multiplanar CT image reconstructions and MIPs were obtained to  evaluate the vascular anatomy. Carotid stenosis measurements (when applicable) are obtained utilizing NASCET criteria, using the distal internal carotid diameter as the denominator. Multiphase CT imaging of the brain was performed following IV bolus contrast injection. Subsequent parametric perfusion maps were calculated using RAPID software. CONTRAST:  113mL OMNIPAQUE IOHEXOL 350 MG/ML SOLN COMPARISON:  Head CT 05/26/2019. FINDINGS: CT HEAD FINDINGS Brain: Cerebral volume is stable and within normal limits for age. No midline shift, ventriculomegaly, mass effect, evidence of mass lesion, intracranial hemorrhage or evidence of cortically based acute infarction. Gray-white matter differentiation is within normal limits throughout the brain. Chronic partially empty sella. No cortical encephalomalacia identified. Vascular: Calcified atherosclerosis at the skull base. No suspicious intracranial vascular hyperdensity. Skull: No acute osseous abnormality identified. Sinuses/Orbits: Visualized paranasal sinuses and mastoids are stable and well pneumatized. Other: Visualized orbits and scalp soft tissues are within normal limits. ASPECTS [facility] Stroke Program Early CT Score) Total score (0-10 with 10  being normal): 10 Review of the MIP images confirms the above findings CT Brain Perfusion Findings: ASPECTS: 10 CBF (<30%) Volume: 48mL Perfusion (Tmax>6.0s) volume: 33mL (anterior inferior left temporal lobe, likely artifact) Mismatch Volume: 66mL Infarction Location:Not applicable CTA NECK Skeleton: Absent dentition. Cervical disc and endplate degeneration. No acute osseous abnormality identified. Upper chest: Negative. Other neck: Negative. Aortic arch: 3 vessel arch configuration with mild great vessel origin atherosclerosis. Right carotid system: Soft and calcified plaque in the proximal brachiocephalic artery but no significant stenosis. Negative right CCA origin. Mildly tortuous right CCA. Calcified plaque at the right  ICA origin without stenosis. Tortuous vessel distal to the bulb with a kinked appearance (series 9, image 22). Left carotid system: Mild plaque at the left CCA origin without stenosis. Tortuous left CCA. No significant plaque at the left carotid bifurcation or proximal ICA. Tortuous left ICA distal to the bulb similar to the right side with a kinked appearance. Vertebral arteries: Normal proximal right subclavian artery and right vertebral artery origin. The right vertebral artery is patent and within normal limits to the skull base. Mild plaque in the proximal left subclavian artery without stenosis. Normal left vertebral artery origin. Tortuous left V1 segment. The left vertebral is patent and within normal limits to the skull base. CTA HEAD Posterior circulation: The distal right vertebral artery is mildly dominant especially beyond the left PICA origin. No distal vertebral stenosis. Patent vertebrobasilar junction and basilar artery without stenosis. The right AICA appears dominant. Normal SCA and PCA origins. Posterior communicating arteries are diminutive or absent. Bilateral PCA branches are within normal limits. Anterior circulation: Both ICA siphons are patent. Moderate calcified plaque of the left siphon cavernous segment with associated moderate to severe stenosis in the distal cavernous segment (radiographic string sign series 6, image 93 and series 7, image 105 just proximal to the anterior genu. Supraclinoid left ICA remains patent and within normal limits. Mild to moderate right cavernous ICA calcified plaque and mild to moderate stenosis (series 5, image 109). Patent and negative supraclinoid right ICA. Patent carotid termini, MCA and ACA origins. Anterior communicating artery is diminutive or absent. Bilateral ACA branches are within normal limits. Left MCA M1 segment and bifurcation are patent without stenosis. Right MCA M1 segment and bifurcation are patent without stenosis. Bilateral MCA branches  are within normal limits. Venous sinuses: Patent. Anatomic variants: Mildly dominant right vertebral artery. Review of the MIP images confirms the above findings IMPRESSION: 1. Negative for large vessel occlusion. No core infarct detected by CT Perfusion. Stable and negative plain CT appearance of the brain. 2. 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 There is contralateral mild to moderate right ICA siphon plaque and stenosis. 3. But there is very little extracranial atherosclerosis, with no significant vertebral artery or cervical carotid stenosis. Electronically Signed: By: Genevie Ann M.D. On: 05/28/2020 18:32   CT HEAD CODE STROKE WO CONTRAST  Addendum Date: 05/28/2020   ADDENDUM REPORT: 05/28/2020 18:58 ADDENDUM: Study discussed by telephone with Dr. Jacqualine Code on 05/28/2020 at 1837 hours. Electronically Signed   By: Genevie Ann M.D.   On: 05/28/2020 18:58   Result Date: 05/28/2020 CLINICAL DATA:  Code stroke. 59 year old female with sudden onset left side weakness and heaviness. EXAM: CT HEAD WITHOUT CONTRAST CT ANGIOGRAPHY HEAD AND NECK CT PERFUSION BRAIN TECHNIQUE: Multidetector CT imaging of the head, and CTA head and neck was performed using the standard protocol during bolus administration of intravenous contrast.  Multiplanar CT image reconstructions and MIPs were obtained to evaluate the vascular anatomy. Carotid stenosis measurements (when applicable) are obtained utilizing NASCET criteria, using the distal internal carotid diameter as the denominator. Multiphase CT imaging of the brain was performed following IV bolus contrast injection. Subsequent parametric perfusion maps were calculated using RAPID software. CONTRAST:  OMNIPAQUE IOHEXOL 350 MG/ML SOLN COMPARISON:  Head CT 05/26/2019. FINDINGS: CT HEAD FINDINGS Brain: Cerebral volume is stable and within normal limits for age. No midline shift, ventriculomegaly, mass effect, evidence  of mass lesion, intracranial hemorrhage or evidence of cortically based acute infarction. Gray-white matter differentiation is within normal limits throughout the brain. Chronic partially empty sella. No cortical encephalomalacia identified. Vascular: Calcified atherosclerosis at the skull base. No suspicious intracranial vascular hyperdensity. Skull: No acute osseous abnormality identified. Sinuses/Orbits: Visualized paranasal sinuses and mastoids are stable and well pneumatized. Other: Visualized orbits and scalp soft tissues are within normal limits. ASPECTS [facility] Stroke Program Early CT Score) Total score (0-10 with 10 being normal): 10 Review of the MIP images confirms the above findings CT Brain Perfusion Findings: ASPECTS: 10 CBF (<30%) Volume: 76mL Perfusion (Tmax>6.0s) volume: 46mL (anterior inferior left temporal lobe, likely artifact) Mismatch Volume: 87mL Infarction Location:Not applicable CTA NECK Skeleton: Absent dentition. Cervical disc and endplate degeneration. No acute osseous abnormality identified. Upper chest: Negative. Other neck: Negative. Aortic arch: 3 vessel arch configuration with mild great vessel origin atherosclerosis. Right carotid system: Soft and calcified plaque in the proximal brachiocephalic artery but no significant stenosis. Negative right CCA origin. Mildly tortuous right CCA. Calcified plaque at the right ICA origin without stenosis. Tortuous vessel distal to the bulb with a kinked appearance (series 9, image 22). Left carotid system: Mild plaque at the left CCA origin without stenosis. Tortuous left CCA. No significant plaque at the left carotid bifurcation or proximal ICA. Tortuous left ICA distal to the bulb similar to the right side with a kinked appearance. Vertebral arteries: Normal proximal right subclavian artery and right vertebral artery origin. The right vertebral artery is patent and within normal limits to the skull base. Mild plaque in the proximal left  subclavian artery without stenosis. Normal left vertebral artery origin. Tortuous left V1 segment. The left vertebral is patent and within normal limits to the skull base. CTA HEAD Posterior circulation: The distal right vertebral artery is mildly dominant especially beyond the left PICA origin. No distal vertebral stenosis. Patent vertebrobasilar junction and basilar artery without stenosis. The right AICA appears dominant. Normal SCA and PCA origins. Posterior communicating arteries are diminutive or absent. Bilateral PCA branches are within normal limits. Anterior circulation: Both ICA siphons are patent. Moderate calcified plaque of the left siphon cavernous segment with associated moderate to severe stenosis in the distal cavernous segment (radiographic string sign series 6, image 93 and series 7, image 105 just proximal to the anterior genu. Supraclinoid left ICA remains patent and within normal limits. Mild to moderate right cavernous ICA calcified plaque and mild to moderate stenosis (series 5, image 109). Patent and negative supraclinoid right ICA. Patent carotid termini, MCA and ACA origins. Anterior communicating artery is diminutive or absent. Bilateral ACA branches are within normal limits. Left MCA M1 segment and bifurcation are patent without stenosis. Right MCA M1 segment and bifurcation are patent without stenosis. Bilateral MCA branches are within normal limits. Venous sinuses: Patent. Anatomic variants: Mildly dominant right vertebral artery. Review of the MIP images confirms the above findings IMPRESSION: 1. Negative for large vessel occlusion. No core infarct  detected by CT Perfusion. Stable and negative plain CT appearance of the brain. 2. 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 There is contralateral mild to moderate right ICA siphon plaque and stenosis. 3. But there is very little extracranial atherosclerosis, with no  significant vertebral artery or cervical carotid stenosis. Electronically Signed: By: Genevie Ann M.D. On: 05/28/2020 18:32    Medications:  I have reviewed the patient's current medications. Scheduled: . insulin aspart  0-9 Units Subcutaneous Q4H    Assessment/Plan: 59 y.o. female with a history of CAD, HTN and DM presenting with left sided headache and left sided "heaviness".  On neurological examination noted to have some mild LLE weakness that is unclear if related to pain.  At baseline has right sided complaints from her back pathology.  Head CT personally reviewed and shows no acute changes.  CTA of the head and neck personally reviewed as well. No evidence of LVO.  Severe stenosis noted at the left ICA for which patient would be considered asymptomatic.  Mild to moderate ICA stenosis.   Patient on ASA daily.   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  BP elevated as well.  Further work up recommended.    Stroke Risk Factors - diabetes mellitus and hypertension  Plan: 1. HgbA1c, fasting lipid panel, ESR, CRP 2. MRI of the brain without contrast.  If no evidence of acute infarct, further stroke work up not indicated at this time but would consider MRI of the cervical, thoracic and lumbar spines. 3. PT consult, OT consult, Speech consult 4. Prophylactic therapy-Continue ASA 5. NPO until RN stroke swallow screen 6. Telemetry monitoring 7. Frequent neuro checks 8. Liberal BP control until acute infarct ruled out with MR imaging.   9. Vascular to see patient for asymptomatic left ICA stenosis   LOS: 0 days   Alexis Goodell, MD Neurology  05/29/2020  10:27 AM

## 2020-05-29 NOTE — ED Notes (Signed)
HA1C obtained, Creat obtained and Resp swab obtained and taken to lab.

## 2020-05-29 NOTE — Progress Notes (Signed)
OT Cancellation Note  Patient Details Name: Penny Medina MRN: [id number] DOB: [birth year]   Cancelled Treatment:    Reason Eval/Treat Not Completed: Other (comment). Consult received, chart reviewed. Pt noted with MRI of brain and cervical/thoracic/lumbar spine at this time. Will hold OT evaluation at this time and re-attempt following imaging and updated plan of care.   Richrd Prime, MPH, MS, OTR/L ascom [phone] 05/29/20, 4:30 PM

## 2020-05-29 NOTE — ED Notes (Signed)
Pt cleaned and new brief in place. Pt dry and bed linen replaced.

## 2020-05-29 NOTE — Progress Notes (Signed)
PROGRESS NOTE    Penny Medina  ZOX:[id number] DOB: 1961/06/25 DOA: 05/28/2020 PCP: Patient, No Pcp Per    Brief Narrative:  Penny Medina is a 59 y.o. female with medical history significant of migraines, DM2, HTN Cervical radiculopathy, CAD Presented with   Severe headache since 3 AM  Developed some speech problems and heavy ness on the left In afternoon developed left side weakness She initially took Neurontin and Robaxin to help with her headache but with no improvement.  10/14-still feels heaviness of LT . No other complaints.   Consultants:   neurology  Procedures:   Antimicrobials:       Subjective: See above.   Objective: Vitals:   05/29/20 0920 05/29/20 0922 05/29/20 1000 05/29/20 1523  BP: (!) 146/84  (!) 164/92 (!) 158/80  Pulse: 71  62 74  Resp:    18  Temp:    98.8 F (37.1 C)  TempSrc:    Oral  SpO2: 99% 98% 96% 99%  Weight:      Height:       No intake or output data in the 24 hours ending 05/29/20 1533 Filed Weights   05/28/20 1718  Weight: 118.2 kg    Examination:  General exam: Appears calm and comfortable  Respiratory system: Clear to auscultation. Respiratory effort normal. Cardiovascular system: S1 & S2 heard, RRR. No JVD, murmurs, rubs, gallops or clicks.  Gastrointestinal system: Abdomen is nondistended, soft and nontender. . Normal bowel sounds heard. Central nervous system: Alert and oriented. Mild LLE and LUE 4/5. No droop or drift Extremities: mild b/l edema Skin: warm, dry Psychiatry: Judgement and insight appear normal. Mood & affect appropriate.     Data Reviewed: I have personally reviewed following labs and imaging studies  CBC: Recent Labs  Lab 05/28/20 1730  WBC 6.3  NEUTROABS 2.8  HGB 12.7  HCT 42.5  MCV 66.8*  PLT 196   Basic Metabolic Panel: Recent Labs  Lab 05/28/20 1730  NA 138  K 3.8  CL 100  CO2 27  GLUCOSE 215*  BUN 6  CREATININE 0.86  CALCIUM 9.8   GFR: Estimated Creatinine Clearance:  92.9 mL/min (by C-G formula based on SCr of 0.86 mg/dL). Liver Function Tests: Recent Labs  Lab 05/28/20 1730  AST 18  ALT 14  ALKPHOS 72  BILITOT 0.6  PROT 8.5*  ALBUMIN 4.2   No results for input(s): LIPASE, AMYLASE in the last 168 hours. No results for input(s): AMMONIA in the last 168 hours. Coagulation Profile: Recent Labs  Lab 05/28/20 1730  INR 1.0   Cardiac Enzymes: No results for input(s): CKTOTAL, CKMB, CKMBINDEX, TROPONINI in the last 168 hours. BNP (last 3 results) No results for input(s): PROBNP in the last 8760 hours. HbA1C: No results for input(s): HGBA1C in the last 72 hours. CBG: Recent Labs  Lab 05/28/20 2202 05/29/20 0307 05/29/20 0809 05/29/20 1147 05/29/20 1525  GLUCAP 197* 224* 222* 242* 187*   Lipid Profile: No results for input(s): CHOL, HDL, LDLCALC, TRIG, CHOLHDL, LDLDIRECT in the last 72 hours. Thyroid Function Tests: No results for input(s): TSH, T4TOTAL, FREET4, T3FREE, THYROIDAB in the last 72 hours. Anemia Panel: No results for input(s): VITAMINB12, FOLATE, FERRITIN, TIBC, IRON, RETICCTPCT in the last 72 hours. Sepsis Labs: No results for input(s): PROCALCITON, LATICACIDVEN in the last 168 hours.  Recent Results (from the past 240 hour(s))  Respiratory Panel by RT PCR (Flu A&B, Covid) - Nasopharyngeal Swab     Status: None   Collection Time:  05/29/20  3:10 AM   Specimen: Nasopharyngeal Swab  Result Value Ref Range Status   SARS Coronavirus 2 by RT PCR NEGATIVE NEGATIVE Final    Comment: (NOTE) SARS-CoV-2 target nucleic acids are NOT DETECTED.  The SARS-CoV-2 RNA is generally detectable in upper respiratoy specimens during the acute phase of infection. The lowest concentration of SARS-CoV-2 viral copies this assay can detect is 131 copies/mL. A negative result does not preclude SARS-Cov-2 infection and should not be used as the sole basis for treatment or other patient management decisions. A negative result may occur with    improper specimen collection/handling, submission of specimen other than nasopharyngeal swab, presence of viral mutation(s) within the areas targeted by this assay, and inadequate number of viral copies (<131 copies/mL). A negative result must be combined with clinical observations, patient history, and epidemiological information. The expected result is Negative.  Fact Sheet for Patients:  https://www.moore.com/  Fact Sheet for Healthcare Providers:  https://www.young.biz/  This test is no t yet approved or cleared by the [place] FDA and  has been authorized for detection and/or diagnosis of SARS-CoV-2 by FDA under an Emergency Use Authorization (EUA). This EUA will remain  in effect (meaning this test can be used) for the duration of the COVID-19 declaration under Section 564(b)(1) of the Act, 21 U.S.C. section 360bbb-3(b)(1), unless the authorization is terminated or revoked sooner.     Influenza A by PCR NEGATIVE NEGATIVE Final   Influenza B by PCR NEGATIVE NEGATIVE Final    Comment: (NOTE) The Xpert Xpress SARS-CoV-2/FLU/RSV assay is intended as an aid in  the diagnosis of influenza from Nasopharyngeal swab specimens and  should not be used as a sole basis for treatment. Nasal washings and  aspirates are unacceptable for Xpert Xpress SARS-CoV-2/FLU/RSV  testing.  Fact Sheet for Patients: https://www.moore.com/  Fact Sheet for Healthcare Providers: https://www.young.biz/  This test is not yet approved or cleared by the [place] FDA and  has been authorized for detection and/or diagnosis of SARS-CoV-2 by  FDA under an Emergency Use Authorization (EUA). This EUA will remain  in effect (meaning this test can be used) for the duration of the  Covid-19 declaration under Section 564(b)(1) of the Act, 21  U.S.C. section 360bbb-3(b)(1), unless the authorization is  terminated or  revoked. Performed at [facility], [address] [place], [place] [address]          Radiology Studies: CT Angio Head W or Wo Contrast  Addendum Date: 05/28/2020   ADDENDUM REPORT: 05/28/2020 18:58 ADDENDUM: Study discussed by telephone with Dr. Fanny Bien on 05/28/2020 at 1837 hours. Electronically Signed   By: Odessa Fleming M.D.   On: 05/28/2020 18:58   Result Date: 05/28/2020 CLINICAL DATA:  Code stroke. 59 year old female with sudden onset left side weakness and heaviness. EXAM: CT HEAD WITHOUT CONTRAST CT ANGIOGRAPHY HEAD AND NECK CT PERFUSION BRAIN TECHNIQUE: Multidetector CT imaging of the head, and CTA head and neck was performed using the standard protocol during bolus administration of intravenous contrast. Multiplanar CT image reconstructions and MIPs were obtained to evaluate the vascular anatomy. Carotid stenosis measurements (when applicable) are obtained utilizing NASCET criteria, using the distal internal carotid diameter as the denominator. Multiphase CT imaging of the brain was performed following IV bolus contrast injection. Subsequent parametric perfusion maps were calculated using RAPID software. CONTRAST:  OMNIPAQUE IOHEXOL 350 MG/ML SOLN COMPARISON:  Head CT 05/26/2019. FINDINGS: CT HEAD FINDINGS Brain: Cerebral volume is stable and within normal limits for  age. No midline shift, ventriculomegaly, mass effect, evidence of mass lesion, intracranial hemorrhage or evidence of cortically based acute infarction. Gray-white matter differentiation is within normal limits throughout the brain. Chronic partially empty sella. No cortical encephalomalacia identified. Vascular: Calcified atherosclerosis at the skull base. No suspicious intracranial vascular hyperdensity. Skull: No acute osseous abnormality identified. Sinuses/Orbits: Visualized paranasal sinuses and mastoids are stable and well pneumatized. Other: Visualized orbits and scalp soft tissues are within normal limits.  ASPECTS ([facility] Program Early CT Score) Total score (0-10 with 10 being normal): 10 Review of the MIP images confirms the above findings CT Brain Perfusion Findings: ASPECTS: 10 CBF (<30%) Volume: 0mL Perfusion (Tmax>6.0s) volume: 3mL (anterior inferior left temporal lobe, likely artifact) Mismatch Volume: 3mL Infarction Location:Not applicable CTA NECK Skeleton: Absent dentition. Cervical disc and endplate degeneration. No acute osseous abnormality identified. Upper chest: Negative. Other neck: Negative. Aortic arch: 3 vessel arch configuration with mild great vessel origin atherosclerosis. Right carotid system: Soft and calcified plaque in the proximal brachiocephalic artery but no significant stenosis. Negative right CCA origin. Mildly tortuous right CCA. Calcified plaque at the right ICA origin without stenosis. Tortuous vessel distal to the bulb with a kinked appearance (series 9, image 22). Left carotid system: Mild plaque at the left CCA origin without stenosis. Tortuous left CCA. No significant plaque at the left carotid bifurcation or proximal ICA. Tortuous left ICA distal to the bulb similar to the right side with a kinked appearance. Vertebral arteries: Normal proximal right subclavian artery and right vertebral artery origin. The right vertebral artery is patent and within normal limits to the skull base. Mild plaque in the proximal left subclavian artery without stenosis. Normal left vertebral artery origin. Tortuous left V1 segment. The left vertebral is patent and within normal limits to the skull base. CTA HEAD Posterior circulation: The distal right vertebral artery is mildly dominant especially beyond the left PICA origin. No distal vertebral stenosis. Patent vertebrobasilar junction and basilar artery without stenosis. The right AICA appears dominant. Normal SCA and PCA origins. Posterior communicating arteries are diminutive or absent. Bilateral PCA branches are within normal limits.  Anterior circulation: Both ICA siphons are patent. Moderate calcified plaque of the left siphon cavernous segment with associated moderate to severe stenosis in the distal cavernous segment (radiographic string sign series 6, image 93 and series 7, image 105 just proximal to the anterior genu. Supraclinoid left ICA remains patent and within normal limits. Mild to moderate right cavernous ICA calcified plaque and mild to moderate stenosis (series 5, image 109). Patent and negative supraclinoid right ICA. Patent carotid termini, MCA and ACA origins. Anterior communicating artery is diminutive or absent. Bilateral ACA branches are within normal limits. Left MCA M1 segment and bifurcation are patent without stenosis. Right MCA M1 segment and bifurcation are patent without stenosis. Bilateral MCA branches are within normal limits. Venous sinuses: Patent. Anatomic variants: Mildly dominant right vertebral artery. Review of the MIP images confirms the above findings IMPRESSION: 1. Negative for large vessel occlusion. No core infarct detected by CT Perfusion. Stable and negative plain CT appearance of the brain. 2. 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 There is contralateral mild to moderate right ICA siphon plaque and stenosis. 3. But there is very little extracranial atherosclerosis, with no significant vertebral artery or cervical carotid stenosis. Electronically Signed: By: Odessa FlemingH  Hall M.D. On: 05/28/2020 18:32   DG Chest 2 View  Result Date: 05/28/2020 CLINICAL DATA:  Left-sided weakness and headache.  Hypertension EXAM: CHEST - 2 VIEW COMPARISON:  CT angiogram chest July 26, 2019 FINDINGS: Lungs are clear. There is cardiomegaly with pulmonary vascularity normal. No adenopathy. No bone lesions. IMPRESSION: Cardiomegaly.  Lungs clear.  No adenopathy evident. Electronically Signed   By: Bretta Bang III M.D.   On: 05/28/2020 19:27   CT Angio  Neck W and/or Wo Contrast  Addendum Date: 05/28/2020   ADDENDUM REPORT: 05/28/2020 18:58 ADDENDUM: Study discussed by telephone with Dr. Fanny Bien on 05/28/2020 at 1837 hours. Electronically Signed   By: Odessa Fleming M.D.   On: 05/28/2020 18:58   Result Date: 05/28/2020 CLINICAL DATA:  Code stroke. 59 year old female with sudden onset left side weakness and heaviness. EXAM: CT HEAD WITHOUT CONTRAST CT ANGIOGRAPHY HEAD AND NECK CT PERFUSION BRAIN TECHNIQUE: Multidetector CT imaging of the head, and CTA head and neck was performed using the standard protocol during bolus administration of intravenous contrast. Multiplanar CT image reconstructions and MIPs were obtained to evaluate the vascular anatomy. Carotid stenosis measurements (when applicable) are obtained utilizing NASCET criteria, using the distal internal carotid diameter as the denominator. Multiphase CT imaging of the brain was performed following IV bolus contrast injection. Subsequent parametric perfusion maps were calculated using RAPID software. CONTRAST:  OMNIPAQUE IOHEXOL 350 MG/ML SOLN COMPARISON:  Head CT 05/26/2019. FINDINGS: CT HEAD FINDINGS Brain: Cerebral volume is stable and within normal limits for age. No midline shift, ventriculomegaly, mass effect, evidence of mass lesion, intracranial hemorrhage or evidence of cortically based acute infarction. Gray-white matter differentiation is within normal limits throughout the brain. Chronic partially empty sella. No cortical encephalomalacia identified. Vascular: Calcified atherosclerosis at the skull base. No suspicious intracranial vascular hyperdensity. Skull: No acute osseous abnormality identified. Sinuses/Orbits: Visualized paranasal sinuses and mastoids are stable and well pneumatized. Other: Visualized orbits and scalp soft tissues are within normal limits. ASPECTS [facility] Stroke Program Early CT Score) Total score (0-10 with 10 being normal): 10 Review of the MIP images confirms the  above findings CT Brain Perfusion Findings: ASPECTS: 10 CBF (<30%) Volume: 47mL Perfusion (Tmax>6.0s) volume: 83mL (anterior inferior left temporal lobe, likely artifact) Mismatch Volume: 29mL Infarction Location:Not applicable CTA NECK Skeleton: Absent dentition. Cervical disc and endplate degeneration. No acute osseous abnormality identified. Upper chest: Negative. Other neck: Negative. Aortic arch: 3 vessel arch configuration with mild great vessel origin atherosclerosis. Right carotid system: Soft and calcified plaque in the proximal brachiocephalic artery but no significant stenosis. Negative right CCA origin. Mildly tortuous right CCA. Calcified plaque at the right ICA origin without stenosis. Tortuous vessel distal to the bulb with a kinked appearance (series 9, image 22). Left carotid system: Mild plaque at the left CCA origin without stenosis. Tortuous left CCA. No significant plaque at the left carotid bifurcation or proximal ICA. Tortuous left ICA distal to the bulb similar to the right side with a kinked appearance. Vertebral arteries: Normal proximal right subclavian artery and right vertebral artery origin. The right vertebral artery is patent and within normal limits to the skull base. Mild plaque in the proximal left subclavian artery without stenosis. Normal left vertebral artery origin. Tortuous left V1 segment. The left vertebral is patent and within normal limits to the skull base. CTA HEAD Posterior circulation: The distal right vertebral artery is mildly dominant especially beyond the left PICA origin. No distal vertebral stenosis. Patent vertebrobasilar junction and basilar artery without stenosis. The right AICA appears dominant. Normal SCA and PCA origins. Posterior communicating arteries are diminutive or  absent. Bilateral PCA branches are within normal limits. Anterior circulation: Both ICA siphons are patent. Moderate calcified plaque of the left siphon cavernous segment with associated  moderate to severe stenosis in the distal cavernous segment (radiographic string sign series 6, image 93 and series 7, image 105 just proximal to the anterior genu. Supraclinoid left ICA remains patent and within normal limits. Mild to moderate right cavernous ICA calcified plaque and mild to moderate stenosis (series 5, image 109). Patent and negative supraclinoid right ICA. Patent carotid termini, MCA and ACA origins. Anterior communicating artery is diminutive or absent. Bilateral ACA branches are within normal limits. Left MCA M1 segment and bifurcation are patent without stenosis. Right MCA M1 segment and bifurcation are patent without stenosis. Bilateral MCA branches are within normal limits. Venous sinuses: Patent. Anatomic variants: Mildly dominant right vertebral artery. Review of the MIP images confirms the above findings IMPRESSION: 1. Negative for large vessel occlusion. No core infarct detected by CT Perfusion. Stable and negative plain CT appearance of the brain. 2. 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 There is contralateral mild to moderate right ICA siphon plaque and stenosis. 3. But there is very little extracranial atherosclerosis, with no significant vertebral artery or cervical carotid stenosis. Electronically Signed: By: Odessa Fleming M.D. On: 05/28/2020 18:32   MR BRAIN WO CONTRAST  Result Date: 05/29/2020 CLINICAL DATA:  Abnormal speech, left-sided weakness EXAM: MRI HEAD WITHOUT CONTRAST TECHNIQUE: Multiplanar, multiecho pulse sequences of the brain and surrounding structures were obtained without intravenous contrast. COMPARISON:  None. FINDINGS: Brain: There is no acute infarction or intracranial hemorrhage. There is no intracranial mass, mass effect, or edema. There is no hydrocephalus or extra-axial fluid collection. There are minimal small foci of T2 hyperintensity in the supratentorial white matter likely reflecting  nonspecific gliosis/demyelination potentially due to chronic microvascular ischemic changes in a patient of this age. Ventricles and sulci are normal in size and configuration. Vascular: Major vessel flow voids at the skull base are preserved. Skull and upper cervical spine: Normal marrow signal is preserved. Sinuses/Orbits: Paranasal sinuses are aerated. Orbits are unremarkable. Other: Sella is partially empty.  Mastoid air cells are clear. IMPRESSION: No evidence of recent infarction, hemorrhage, or mass. Probable minor chronic microvascular ischemic changes. Electronically Signed   By: Guadlupe Spanish M.D.   On: 05/29/2020 14:19   CT CEREBRAL PERFUSION W CONTRAST  Addendum Date: 05/28/2020   ADDENDUM REPORT: 05/28/2020 18:58 ADDENDUM: Study discussed by telephone with Dr. Fanny Bien on 05/28/2020 at 1837 hours. Electronically Signed   By: Odessa Fleming M.D.   On: 05/28/2020 18:58   Result Date: 05/28/2020 CLINICAL DATA:  Code stroke. 59 year old female with sudden onset left side weakness and heaviness. EXAM: CT HEAD WITHOUT CONTRAST CT ANGIOGRAPHY HEAD AND NECK CT PERFUSION BRAIN TECHNIQUE: Multidetector CT imaging of the head, and CTA head and neck was performed using the standard protocol during bolus administration of intravenous contrast. Multiplanar CT image reconstructions and MIPs were obtained to evaluate the vascular anatomy. Carotid stenosis measurements (when applicable) are obtained utilizing NASCET criteria, using the distal internal carotid diameter as the denominator. Multiphase CT imaging of the brain was performed following IV bolus contrast injection. Subsequent parametric perfusion maps were calculated using RAPID software. CONTRAST:  OMNIPAQUE IOHEXOL 350 MG/ML SOLN COMPARISON:  Head CT 05/26/2019. FINDINGS: CT HEAD FINDINGS Brain: Cerebral volume is stable and within normal limits for age. No midline shift, ventriculomegaly, mass effect, evidence of  mass lesion, intracranial hemorrhage or  evidence of cortically based acute infarction. Gray-white matter differentiation is within normal limits throughout the brain. Chronic partially empty sella. No cortical encephalomalacia identified. Vascular: Calcified atherosclerosis at the skull base. No suspicious intracranial vascular hyperdensity. Skull: No acute osseous abnormality identified. Sinuses/Orbits: Visualized paranasal sinuses and mastoids are stable and well pneumatized. Other: Visualized orbits and scalp soft tissues are within normal limits. ASPECTS [facility] Stroke Program Early CT Score) Total score (0-10 with 10 being normal): 10 Review of the MIP images confirms the above findings CT Brain Perfusion Findings: ASPECTS: 10 CBF (<30%) Volume: 0mL Perfusion (Tmax>6.0s) volume: 3mL (anterior inferior left temporal lobe, likely artifact) Mismatch Volume: 3mL Infarction Location:Not applicable CTA NECK Skeleton: Absent dentition. Cervical disc and endplate degeneration. No acute osseous abnormality identified. Upper chest: Negative. Other neck: Negative. Aortic arch: 3 vessel arch configuration with mild great vessel origin atherosclerosis. Right carotid system: Soft and calcified plaque in the proximal brachiocephalic artery but no significant stenosis. Negative right CCA origin. Mildly tortuous right CCA. Calcified plaque at the right ICA origin without stenosis. Tortuous vessel distal to the bulb with a kinked appearance (series 9, image 22). Left carotid system: Mild plaque at the left CCA origin without stenosis. Tortuous left CCA. No significant plaque at the left carotid bifurcation or proximal ICA. Tortuous left ICA distal to the bulb similar to the right side with a kinked appearance. Vertebral arteries: Normal proximal right subclavian artery and right vertebral artery origin. The right vertebral artery is patent and within normal limits to the skull base. Mild plaque in the proximal left subclavian artery without stenosis. Normal left  vertebral artery origin. Tortuous left V1 segment. The left vertebral is patent and within normal limits to the skull base. CTA HEAD Posterior circulation: The distal right vertebral artery is mildly dominant especially beyond the left PICA origin. No distal vertebral stenosis. Patent vertebrobasilar junction and basilar artery without stenosis. The right AICA appears dominant. Normal SCA and PCA origins. Posterior communicating arteries are diminutive or absent. Bilateral PCA branches are within normal limits. Anterior circulation: Both ICA siphons are patent. Moderate calcified plaque of the left siphon cavernous segment with associated moderate to severe stenosis in the distal cavernous segment (radiographic string sign series 6, image 93 and series 7, image 105 just proximal to the anterior genu. Supraclinoid left ICA remains patent and within normal limits. Mild to moderate right cavernous ICA calcified plaque and mild to moderate stenosis (series 5, image 109). Patent and negative supraclinoid right ICA. Patent carotid termini, MCA and ACA origins. Anterior communicating artery is diminutive or absent. Bilateral ACA branches are within normal limits. Left MCA M1 segment and bifurcation are patent without stenosis. Right MCA M1 segment and bifurcation are patent without stenosis. Bilateral MCA branches are within normal limits. Venous sinuses: Patent. Anatomic variants: Mildly dominant right vertebral artery. Review of the MIP images confirms the above findings IMPRESSION: 1. Negative for large vessel occlusion. No core infarct detected by CT Perfusion. Stable and negative plain CT appearance of the brain. 2. 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 There is contralateral mild to moderate right ICA siphon plaque and stenosis. 3. But there is very little extracranial atherosclerosis, with no significant vertebral artery or cervical carotid  stenosis. Electronically Signed: By: Odessa Fleming M.D. On: 05/28/2020 18:32   CT HEAD CODE STROKE WO CONTRAST  Addendum Date: 05/28/2020   ADDENDUM REPORT: 05/28/2020 18:58 ADDENDUM: Sarita Bottom  discussed by telephone with Dr. Fanny Bien on 05/28/2020 at 1837 hours. Electronically Signed   By: Odessa Fleming M.D.   On: 05/28/2020 18:58   Result Date: 05/28/2020 CLINICAL DATA:  Code stroke. 59 year old female with sudden onset left side weakness and heaviness. EXAM: CT HEAD WITHOUT CONTRAST CT ANGIOGRAPHY HEAD AND NECK CT PERFUSION BRAIN TECHNIQUE: Multidetector CT imaging of the head, and CTA head and neck was performed using the standard protocol during bolus administration of intravenous contrast. Multiplanar CT image reconstructions and MIPs were obtained to evaluate the vascular anatomy. Carotid stenosis measurements (when applicable) are obtained utilizing NASCET criteria, using the distal internal carotid diameter as the denominator. Multiphase CT imaging of the brain was performed following IV bolus contrast injection. Subsequent parametric perfusion maps were calculated using RAPID software. CONTRAST:  OMNIPAQUE IOHEXOL 350 MG/ML SOLN COMPARISON:  Head CT 05/26/2019. FINDINGS: CT HEAD FINDINGS Brain: Cerebral volume is stable and within normal limits for age. No midline shift, ventriculomegaly, mass effect, evidence of mass lesion, intracranial hemorrhage or evidence of cortically based acute infarction. Gray-white matter differentiation is within normal limits throughout the brain. Chronic partially empty sella. No cortical encephalomalacia identified. Vascular: Calcified atherosclerosis at the skull base. No suspicious intracranial vascular hyperdensity. Skull: No acute osseous abnormality identified. Sinuses/Orbits: Visualized paranasal sinuses and mastoids are stable and well pneumatized. Other: Visualized orbits and scalp soft tissues are within normal limits. ASPECTS [facility] Stroke Program Early CT Score)  Total score (0-10 with 10 being normal): 10 Review of the MIP images confirms the above findings CT Brain Perfusion Findings: ASPECTS: 10 CBF (<30%) Volume: 10mL Perfusion (Tmax>6.0s) volume: 29mL (anterior inferior left temporal lobe, likely artifact) Mismatch Volume: 23mL Infarction Location:Not applicable CTA NECK Skeleton: Absent dentition. Cervical disc and endplate degeneration. No acute osseous abnormality identified. Upper chest: Negative. Other neck: Negative. Aortic arch: 3 vessel arch configuration with mild great vessel origin atherosclerosis. Right carotid system: Soft and calcified plaque in the proximal brachiocephalic artery but no significant stenosis. Negative right CCA origin. Mildly tortuous right CCA. Calcified plaque at the right ICA origin without stenosis. Tortuous vessel distal to the bulb with a kinked appearance (series 9, image 22). Left carotid system: Mild plaque at the left CCA origin without stenosis. Tortuous left CCA. No significant plaque at the left carotid bifurcation or proximal ICA. Tortuous left ICA distal to the bulb similar to the right side with a kinked appearance. Vertebral arteries: Normal proximal right subclavian artery and right vertebral artery origin. The right vertebral artery is patent and within normal limits to the skull base. Mild plaque in the proximal left subclavian artery without stenosis. Normal left vertebral artery origin. Tortuous left V1 segment. The left vertebral is patent and within normal limits to the skull base. CTA HEAD Posterior circulation: The distal right vertebral artery is mildly dominant especially beyond the left PICA origin. No distal vertebral stenosis. Patent vertebrobasilar junction and basilar artery without stenosis. The right AICA appears dominant. Normal SCA and PCA origins. Posterior communicating arteries are diminutive or absent. Bilateral PCA branches are within normal limits. Anterior circulation: Both ICA siphons are patent.  Moderate calcified plaque of the left siphon cavernous segment with associated moderate to severe stenosis in the distal cavernous segment (radiographic string sign series 6, image 93 and series 7, image 105 just proximal to the anterior genu. Supraclinoid left ICA remains patent and within normal limits. Mild to moderate right cavernous ICA calcified plaque and mild to moderate stenosis (series 5, image 109). Patent  and negative supraclinoid right ICA. Patent carotid termini, MCA and ACA origins. Anterior communicating artery is diminutive or absent. Bilateral ACA branches are within normal limits. Left MCA M1 segment and bifurcation are patent without stenosis. Right MCA M1 segment and bifurcation are patent without stenosis. Bilateral MCA branches are within normal limits. Venous sinuses: Patent. Anatomic variants: Mildly dominant right vertebral artery. Review of the MIP images confirms the above findings IMPRESSION: 1. Negative for large vessel occlusion. No core infarct detected by CT Perfusion. Stable and negative plain CT appearance of the brain. 2. 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 There is contralateral mild to moderate right ICA siphon plaque and stenosis. 3. But there is very little extracranial atherosclerosis, with no significant vertebral artery or cervical carotid stenosis. Electronically Signed: By: Odessa Fleming M.D. 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  Probable minor chronic microvascular ischemic changes  Echo pending PT OT consulted Neurology consulted-prophylactic therapy with aspirin RN stroke swallowing eval Telemetry Will ck MRI of cervical, thoracic and lumbar spines. -     Essential hypertension - uncontrolled. Will add amlodipine 5 mg qd   Obesity, Class III, BMI 40-49.9 (morbid obesity) (HCC) -  Complicates medical issues.    DM2 -  - somewhat controlled. Will ck A1c RISS     DVT prophylaxis: lovenox Code Status: Full Family Communication: None at bedside  Status is: Observation  The patient remains OBS appropriate and will d/c before 2 midnights.  Dispo: The patient is from: Home              Anticipated d/c is to: home v.s. snf              Anticipated d/c date is: 1 day              Patient currently is not medically stable to d/c.PT/OT evalaution. Needs mRI            LOS: 0 days   Time spent: 35 min with >50% on coc    Lynn Ito, MD [facility] Pager [phone]  If 7PM-7AM, please contact night-coverage [email or web address] Password [facility] 05/29/2020, 3:33 PM

## 2020-05-29 NOTE — Consult Note (Signed)
Cardiology Consultation:   Patient ID: Penny Medina MRN: [id number]; DOB: Mar 06, 1961  Admit date: 05/28/2020 Date of Consult: 05/29/2020  Primary Care Provider: Patient, No Pcp Per [facility] Cardiologist: new to [facility] Consult request by : Dr. Marylu Lund Reason for consult: Heart complex tachycardia   Patient Profile:   Penny Medina is a 59 y.o. female with a hx of morbid obesity, pelvic inflammatory disease, cervical radiculopathy, diabetes, former smoker, aortic atherosclerosis, calcifications in the LAD, presenting with headache, weakness on the left.   History of Present Illness:   Ms. 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 Thad Ranger, MRI was ordered with no documentation of acute stroke Recommended aspirin, Vascular consult requested for consultation of her left ICA stenosis  4:09 PM on October 14 had 35 beat run wide-complex tachycardia On further discussion she is asymptomatic, no near syncope lightheadedness or palpitations  Reports that she will have rare lightheadedness which she always attributed to her diabetes These happen very infrequently and typically when she has them she sits down.  They do not last more than a few seconds He never thought much about them  Currently reports she is very tired, falling asleep while I am talking to her Says that she was up all night and did not sleep  Past Medical History:  Diagnosis Date  . Diabetes mellitus without complication (HCC)   . Heart attack (HCC)   .  Hypertension     Past Surgical History:  Procedure Laterality Date  . CESAREAN SECTION    . TUBAL LIGATION       Home Medications:  Prior to Admission medications   Medication Sig Start Date End Date Taking? Authorizing Provider  amLODipine (NORVASC) 10 MG tablet Take 10 mg by mouth daily.   Yes [provider]  aspirin 81 MG chewable tablet Chew 81 mg by mouth daily.   Yes [provider]  gabapentin (NEURONTIN) 300 MG capsule Take 300 mg by mouth 3 (three) times daily. 12/28/19 05/13/21 Yes [provider]  glipiZIDE (GLUCOTROL) 5 MG tablet Take 5 mg by mouth 2 (two) times daily before a meal.   Yes [provider]  metFORMIN (GLUCOPHAGE) 500 MG tablet Take 500 mg by mouth 2 (two) times daily with a meal.   Yes [provider]  methocarbamol (ROBAXIN) 500 MG tablet Take 500 mg by mouth every 8 (eight) hours as needed for muscle spasms. 05/13/20 06/12/20 Yes [provider]  carvedilol (COREG) 12.5 MG tablet Take 12.5 mg by mouth 2 (two) times daily with a meal. Patient not taking: Reported on 05/28/2020    [provider]  glimepiride (AMARYL) 2 MG tablet Take 2 mg by mouth daily with breakfast. Patient not taking: Reported on 05/28/2020    [provider]    Inpatient Medications: Scheduled Meds: . amLODipine  5 mg Oral Daily  . aspirin  300 mg Rectal Daily   Or  . aspirin  325 mg Oral Daily  . enoxaparin (LOVENOX) injection  40 mg Subcutaneous Q24H  . gabapentin  100 mg Oral TID  .  insulin aspart  0-9 Units Subcutaneous Q4H   Continuous Infusions: . sodium chloride 75 mL/hr at 05/29/20 1529   PRN Meds: acetaminophen **OR** acetaminophen (TYLENOL) oral liquid 160 mg/5 mL **OR** acetaminophen  Allergies:    Allergies  Allergen Reactions  . Penicillins     Patient was told by MD in 1986 not to take PCN any longer (due to being allergic) Unknown reaction per patient  . Sulfa Antibiotics Rash    Social  History:   Social History   Socioeconomic History  . Marital status: Widowed    Spouse name: Not on file  . Number of children: Not on file  . Years of education: Not on file  . Highest education level: Not on file  Occupational History  . Not on file  Tobacco Use  . Smoking status: Former Games developer  . Smokeless tobacco: Never Used  Substance and Sexual Activity  . Alcohol use: No  . Drug use: Never  . Sexual activity: Not Currently    Birth control/protection: None  Other Topics Concern  . Not on file  Social History Narrative  . Not on file   Social Determinants of Health   Financial Resource Strain:   . Difficulty of Paying Living Expenses: Not on file  Food Insecurity:   . Worried About Programme researcher, broadcasting/film/video in the Last Year: Not on file  . Ran Out of Food in the Last Year: Not on file  Transportation Needs:   . Lack of Transportation (Medical): Not on file  . Lack of Transportation (Non-Medical): Not on file  Physical Activity:   . Days of Exercise per Week: Not on file  . Minutes of Exercise per Session: Not on file  Stress:   . Feeling of Stress : Not on file  Social Connections:   . Frequency of Communication with Friends and Family: Not on file  . Frequency of Social Gatherings with Friends and Family: Not on file  . Attends Religious Services: Not on file  . Active Member of Clubs or Organizations: Not on file  . Attends Banker Meetings: Not on file  . Marital Status: Not on file  Intimate Partner Violence:   . Fear of Current or Ex-Partner: Not on file  . Emotionally Abused: Not on file  . Physically Abused: Not on file  . Sexually Abused: Not on file    Family History:    Family History  Problem Relation Age of Onset  . Diabetes Mother   . CVA Mother   . Hypertension Father      ROS:  Please see the history of present illness.  Review of Systems  Constitutional: Negative.        Sleepy  HENT: Negative.   Respiratory: Negative.     Cardiovascular: Negative.   Gastrointestinal: Negative.   Musculoskeletal: Negative.   Neurological: Negative.        Left leg weakness, numb  Psychiatric/Behavioral: Negative.   All other systems reviewed and are negative.     Physical Exam/Data:   Vitals:   05/29/20 0920 05/29/20 0922 05/29/20 1000 05/29/20 1523  BP: (!) 146/84  (!) 164/92 (!) 158/80  Pulse: 71  62 74  Resp:    18  Temp:    98.8 F (37.1 C)  TempSrc:    Oral  SpO2: 99% 98% 96% 99%  Weight:      Height:       No intake or output data in the 24 hours ending  05/29/20 1730 Last 3 Weights 05/28/2020 07/28/2019 07/26/2019  Weight (lbs) 260 lb 9.3 oz 270 lb 270 lb  Weight (kg) 118.2 kg 122.471 kg 122.471 kg     Body mass index is 43.36 kg/m.  General:  Well nourished, well developed, in no acute distress HEENT: normal Lymph: no adenopathy Neck: no JVD Endocrine:  No thryomegaly Vascular: No carotid bruits; FA pulses 2+ bilaterally without bruits  Cardiac:  normal S1, S2; RRR; no murmur  Lungs:  clear to auscultation bilaterally, no wheezing, rhonchi or rales  Abd: soft, nontender, no hepatomegaly  Ext: no edema Musculoskeletal:  No deformities, BUE and BLE strength normal and equal Skin: warm and dry  Neuro:  CNs 2-12 intact, no focal abnormalities noted Psych:  Normal affect   EKG:  The EKG was personally reviewed and demonstrates:   Normal sinus rhythm, no significant ST-T wave changes Telemetry:  Telemetry was personally reviewed and demonstrates: Normal sinus rhythm Run of wide-complex tachycardia 35 beats at 4:09 PM on August 14 No other PVCs or nonsustained VT noted this hospital admission  Relevant CV Studies: Echocardiogram pending  Laboratory Data:  High Sensitivity Troponin:  No results for input(s): TROPONINIHS in the last 720 hours.   Chemistry Recent Labs  Lab 05/28/20 1730 05/29/20 1623  NA 138  --   K 3.8  --   CL 100  --   CO2 27  --   GLUCOSE 215*  --   BUN 6  --    CREATININE 0.86 1.07*  CALCIUM 9.8  --   GFRNONAA >60 58*  ANIONGAP 11  --     Recent Labs  Lab 05/28/20 1730  PROT 8.5*  ALBUMIN 4.2  AST 18  ALT 14  ALKPHOS 72  BILITOT 0.6   Hematology Recent Labs  Lab 05/28/20 1730 05/29/20 1623  WBC 6.3 5.6  RBC 6.36* 5.92*  HGB 12.7 11.8*  HCT 42.5 39.9  MCV 66.8* 67.4*  MCH 20.0* 19.9*  MCHC 29.9* 29.6*  RDW 17.1* 17.2*  PLT 196 178   BNPNo results for input(s): BNP, PROBNP in the last 168 hours.  DDimer No results for input(s): DDIMER in the last 168 hours.   Radiology/Studies:  CT Angio Head W or Wo Contrast  Addendum Date: 05/28/2020   ADDENDUM REPORT: 05/28/2020 18:58 ADDENDUM: Study discussed by telephone with Dr. Fanny Bien on 05/28/2020 at 1837 hours. Electronically Signed   By: Odessa Fleming M.D.   On: 05/28/2020 18:58   Result Date: 05/28/2020 CLINICAL DATA:  Code stroke. 59 year old female with sudden onset left side weakness and heaviness. EXAM: CT HEAD WITHOUT CONTRAST CT ANGIOGRAPHY HEAD AND NECK CT PERFUSION BRAIN TECHNIQUE: Multidetector CT imaging of the head, and CTA head and neck was performed using the standard protocol during bolus administration of intravenous contrast. Multiplanar CT image reconstructions and MIPs were obtained to evaluate the vascular anatomy. Carotid stenosis measurements (when applicable) are obtained utilizing NASCET criteria, using the distal internal carotid diameter as the denominator. Multiphase CT imaging of the brain was performed following IV bolus contrast injection. Subsequent parametric perfusion maps were calculated using RAPID software. CONTRAST:  OMNIPAQUE IOHEXOL 350 MG/ML SOLN COMPARISON:  Head CT 05/26/2019. FINDINGS: CT HEAD FINDINGS Brain: Cerebral volume is stable and within normal limits for age. No midline shift, ventriculomegaly, mass effect, evidence of mass lesion, intracranial hemorrhage or evidence of cortically based acute infarction. Gray-white matter differentiation  is within normal limits throughout the brain. Chronic partially empty sella. No cortical encephalomalacia  identified. Vascular: Calcified atherosclerosis at the skull base. No suspicious intracranial vascular hyperdensity. Skull: No acute osseous abnormality identified. Sinuses/Orbits: Visualized paranasal sinuses and mastoids are stable and well pneumatized. Other: Visualized orbits and scalp soft tissues are within normal limits. ASPECTS [facility] Stroke Program Early CT Score) Total score (0-10 with 10 being normal): 10 Review of the MIP images confirms the above findings CT Brain Perfusion Findings: ASPECTS: 10 CBF (<30%) Volume: 0mL Perfusion (Tmax>6.0s) volume: 3mL (anterior inferior left temporal lobe, likely artifact) Mismatch Volume: 3mL Infarction Location:Not applicable CTA NECK Skeleton: Absent dentition. Cervical disc and endplate degeneration. No acute osseous abnormality identified. Upper chest: Negative. Other neck: Negative. Aortic arch: 3 vessel arch configuration with mild great vessel origin atherosclerosis. Right carotid system: Soft and calcified plaque in the proximal brachiocephalic artery but no significant stenosis. Negative right CCA origin. Mildly tortuous right CCA. Calcified plaque at the right ICA origin without stenosis. Tortuous vessel distal to the bulb with a kinked appearance (series 9, image 22). Left carotid system: Mild plaque at the left CCA origin without stenosis. Tortuous left CCA. No significant plaque at the left carotid bifurcation or proximal ICA. Tortuous left ICA distal to the bulb similar to the right side with a kinked appearance. Vertebral arteries: Normal proximal right subclavian artery and right vertebral artery origin. The right vertebral artery is patent and within normal limits to the skull base. Mild plaque in the proximal left subclavian artery without stenosis. Normal left vertebral artery origin. Tortuous left V1 segment. The left vertebral is patent and  within normal limits to the skull base. CTA HEAD Posterior circulation: The distal right vertebral artery is mildly dominant especially beyond the left PICA origin. No distal vertebral stenosis. Patent vertebrobasilar junction and basilar artery without stenosis. The right AICA appears dominant. Normal SCA and PCA origins. Posterior communicating arteries are diminutive or absent. Bilateral PCA branches are within normal limits. Anterior circulation: Both ICA siphons are patent. Moderate calcified plaque of the left siphon cavernous segment with associated moderate to severe stenosis in the distal cavernous segment (radiographic string sign series 6, image 93 and series 7, image 105 just proximal to the anterior genu. Supraclinoid left ICA remains patent and within normal limits. Mild to moderate right cavernous ICA calcified plaque and mild to moderate stenosis (series 5, image 109). Patent and negative supraclinoid right ICA. Patent carotid termini, MCA and ACA origins. Anterior communicating artery is diminutive or absent. Bilateral ACA branches are within normal limits. Left MCA M1 segment and bifurcation are patent without stenosis. Right MCA M1 segment and bifurcation are patent without stenosis. Bilateral MCA branches are within normal limits. Venous sinuses: Patent. Anatomic variants: Mildly dominant right vertebral artery. Review of the MIP images confirms the above findings IMPRESSION: 1. Negative for large vessel occlusion. No core infarct detected by CT Perfusion. Stable and negative plain CT appearance of the brain. 2. 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 There is contralateral mild to moderate right ICA siphon plaque and stenosis. 3. But there is very little extracranial atherosclerosis, with no significant vertebral artery or cervical carotid stenosis. Electronically Signed: By: Odessa Fleming M.D. On: 05/28/2020 18:32   DG Chest 2  View  Result Date: 05/28/2020 CLINICAL DATA:  Left-sided weakness and headache.  Hypertension EXAM: CHEST - 2 VIEW COMPARISON:  CT angiogram chest July 26, 2019 FINDINGS: Lungs are clear. There is cardiomegaly with pulmonary vascularity normal. No adenopathy. No bone lesions. IMPRESSION:  Cardiomegaly.  Lungs clear.  No adenopathy evident. Electronically Signed   By: Bretta Bang III M.D.   On: 05/28/2020 19:27   CT Angio Neck W and/or Wo Contrast  Addendum Date: 05/28/2020   ADDENDUM REPORT: 05/28/2020 18:58 ADDENDUM: Study discussed by telephone with Dr. Fanny Bien on 05/28/2020 at 1837 hours. Electronically Signed   By: Odessa Fleming M.D.   On: 05/28/2020 18:58   Result Date: 05/28/2020 CLINICAL DATA:  Code stroke. 59 year old female with sudden onset left side weakness and heaviness. EXAM: CT HEAD WITHOUT CONTRAST CT ANGIOGRAPHY HEAD AND NECK CT PERFUSION BRAIN TECHNIQUE: Multidetector CT imaging of the head, and CTA head and neck was performed using the standard protocol during bolus administration of intravenous contrast. Multiplanar CT image reconstructions and MIPs were obtained to evaluate the vascular anatomy. Carotid stenosis measurements (when applicable) are obtained utilizing NASCET criteria, using the distal internal carotid diameter as the denominator. Multiphase CT imaging of the brain was performed following IV bolus contrast injection. Subsequent parametric perfusion maps were calculated using RAPID software. CONTRAST:  OMNIPAQUE IOHEXOL 350 MG/ML SOLN COMPARISON:  Head CT 05/26/2019. FINDINGS: CT HEAD FINDINGS Brain: Cerebral volume is stable and within normal limits for age. No midline shift, ventriculomegaly, mass effect, evidence of mass lesion, intracranial hemorrhage or evidence of cortically based acute infarction. Gray-white matter differentiation is within normal limits throughout the brain. Chronic partially empty sella. No cortical encephalomalacia identified. Vascular:  Calcified atherosclerosis at the skull base. No suspicious intracranial vascular hyperdensity. Skull: No acute osseous abnormality identified. Sinuses/Orbits: Visualized paranasal sinuses and mastoids are stable and well pneumatized. Other: Visualized orbits and scalp soft tissues are within normal limits. ASPECTS [facility] Stroke Program Early CT Score) Total score (0-10 with 10 being normal): 10 Review of the MIP images confirms the above findings CT Brain Perfusion Findings: ASPECTS: 10 CBF (<30%) Volume: 0mL Perfusion (Tmax>6.0s) volume: 3mL (anterior inferior left temporal lobe, likely artifact) Mismatch Volume: 3mL Infarction Location:Not applicable CTA NECK Skeleton: Absent dentition. Cervical disc and endplate degeneration. No acute osseous abnormality identified. Upper chest: Negative. Other neck: Negative. Aortic arch: 3 vessel arch configuration with mild great vessel origin atherosclerosis. Right carotid system: Soft and calcified plaque in the proximal brachiocephalic artery but no significant stenosis. Negative right CCA origin. Mildly tortuous right CCA. Calcified plaque at the right ICA origin without stenosis. Tortuous vessel distal to the bulb with a kinked appearance (series 9, image 22). Left carotid system: Mild plaque at the left CCA origin without stenosis. Tortuous left CCA. No significant plaque at the left carotid bifurcation or proximal ICA. Tortuous left ICA distal to the bulb similar to the right side with a kinked appearance. Vertebral arteries: Normal proximal right subclavian artery and right vertebral artery origin. The right vertebral artery is patent and within normal limits to the skull base. Mild plaque in the proximal left subclavian artery without stenosis. Normal left vertebral artery origin. Tortuous left V1 segment. The left vertebral is patent and within normal limits to the skull base. CTA HEAD Posterior circulation: The distal right vertebral artery is mildly dominant  especially beyond the left PICA origin. No distal vertebral stenosis. Patent vertebrobasilar junction and basilar artery without stenosis. The right AICA appears dominant. Normal SCA and PCA origins. Posterior communicating arteries are diminutive or absent. Bilateral PCA branches are within normal limits. Anterior circulation: Both ICA siphons are patent. Moderate calcified plaque of the left siphon cavernous segment with associated moderate to severe stenosis in the distal cavernous segment (radiographic  string sign series 6, image 93 and series 7, image 105 just proximal to the anterior genu. Supraclinoid left ICA remains patent and within normal limits. Mild to moderate right cavernous ICA calcified plaque and mild to moderate stenosis (series 5, image 109). Patent and negative supraclinoid right ICA. Patent carotid termini, MCA and ACA origins. Anterior communicating artery is diminutive or absent. Bilateral ACA branches are within normal limits. Left MCA M1 segment and bifurcation are patent without stenosis. Right MCA M1 segment and bifurcation are patent without stenosis. Bilateral MCA branches are within normal limits. Venous sinuses: Patent. Anatomic variants: Mildly dominant right vertebral artery. Review of the MIP images confirms the above findings IMPRESSION: 1. Negative for large vessel occlusion. No core infarct detected by CT Perfusion. Stable and negative plain CT appearance of the brain. 2. 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 There is contralateral mild to moderate right ICA siphon plaque and stenosis. 3. But there is very little extracranial atherosclerosis, with no significant vertebral artery or cervical carotid stenosis. Electronically Signed: By: Odessa Fleming M.D. On: 05/28/2020 18:32   MR BRAIN WO CONTRAST  Result Date: 05/29/2020 CLINICAL DATA:  Abnormal speech, left-sided weakness EXAM: MRI HEAD WITHOUT CONTRAST  TECHNIQUE: Multiplanar, multiecho pulse sequences of the brain and surrounding structures were obtained without intravenous contrast. COMPARISON:  None. FINDINGS: Brain: There is no acute infarction or intracranial hemorrhage. There is no intracranial mass, mass effect, or edema. There is no hydrocephalus or extra-axial fluid collection. There are minimal small foci of T2 hyperintensity in the supratentorial white matter likely reflecting nonspecific gliosis/demyelination potentially due to chronic microvascular ischemic changes in a patient of this age. Ventricles and sulci are normal in size and configuration. Vascular: Major vessel flow voids at the skull base are preserved. Skull and upper cervical spine: Normal marrow signal is preserved. Sinuses/Orbits: Paranasal sinuses are aerated. Orbits are unremarkable. Other: Sella is partially empty.  Mastoid air cells are clear. IMPRESSION: No evidence of recent infarction, hemorrhage, or mass. Probable minor chronic microvascular ischemic changes. Electronically Signed   By: Guadlupe Spanish M.D.   On: 05/29/2020 14:19   CT CEREBRAL PERFUSION W CONTRAST  Addendum Date: 05/28/2020   ADDENDUM REPORT: 05/28/2020 18:58 ADDENDUM: Study discussed by telephone with Dr. Fanny Bien on 05/28/2020 at 1837 hours. Electronically Signed   By: Odessa Fleming M.D.   On: 05/28/2020 18:58   Result Date: 05/28/2020 CLINICAL DATA:  Code stroke. 59 year old female with sudden onset left side weakness and heaviness. EXAM: CT HEAD WITHOUT CONTRAST CT ANGIOGRAPHY HEAD AND NECK CT PERFUSION BRAIN TECHNIQUE: Multidetector CT imaging of the head, and CTA head and neck was performed using the standard protocol during bolus administration of intravenous contrast. Multiplanar CT image reconstructions and MIPs were obtained to evaluate the vascular anatomy. Carotid stenosis measurements (when applicable) are obtained utilizing NASCET criteria, using the distal internal carotid diameter as the  denominator. Multiphase CT imaging of the brain was performed following IV bolus contrast injection. Subsequent parametric perfusion maps were calculated using RAPID software. CONTRAST:  OMNIPAQUE IOHEXOL 350 MG/ML SOLN COMPARISON:  Head CT 05/26/2019. FINDINGS: CT HEAD FINDINGS Brain: Cerebral volume is stable and within normal limits for age. No midline shift, ventriculomegaly, mass effect, evidence of mass lesion, intracranial hemorrhage or evidence of cortically based acute infarction. Gray-white matter differentiation is within normal limits throughout the brain. Chronic partially empty sella. No cortical encephalomalacia identified. Vascular: Calcified atherosclerosis at the skull base.  No suspicious intracranial vascular hyperdensity. Skull: No acute osseous abnormality identified. Sinuses/Orbits: Visualized paranasal sinuses and mastoids are stable and well pneumatized. Other: Visualized orbits and scalp soft tissues are within normal limits. ASPECTS [facility] Stroke Program Early CT Score) Total score (0-10 with 10 being normal): 10 Review of the MIP images confirms the above findings CT Brain Perfusion Findings: ASPECTS: 10 CBF (<30%) Volume: 0mL Perfusion (Tmax>6.0s) volume: 3mL (anterior inferior left temporal lobe, likely artifact) Mismatch Volume: 3mL Infarction Location:Not applicable CTA NECK Skeleton: Absent dentition. Cervical disc and endplate degeneration. No acute osseous abnormality identified. Upper chest: Negative. Other neck: Negative. Aortic arch: 3 vessel arch configuration with mild great vessel origin atherosclerosis. Right carotid system: Soft and calcified plaque in the proximal brachiocephalic artery but no significant stenosis. Negative right CCA origin. Mildly tortuous right CCA. Calcified plaque at the right ICA origin without stenosis. Tortuous vessel distal to the bulb with a kinked appearance (series 9, image 22). Left carotid system: Mild plaque at the left CCA origin  without stenosis. Tortuous left CCA. No significant plaque at the left carotid bifurcation or proximal ICA. Tortuous left ICA distal to the bulb similar to the right side with a kinked appearance. Vertebral arteries: Normal proximal right subclavian artery and right vertebral artery origin. The right vertebral artery is patent and within normal limits to the skull base. Mild plaque in the proximal left subclavian artery without stenosis. Normal left vertebral artery origin. Tortuous left V1 segment. The left vertebral is patent and within normal limits to the skull base. CTA HEAD Posterior circulation: The distal right vertebral artery is mildly dominant especially beyond the left PICA origin. No distal vertebral stenosis. Patent vertebrobasilar junction and basilar artery without stenosis. The right AICA appears dominant. Normal SCA and PCA origins. Posterior communicating arteries are diminutive or absent. Bilateral PCA branches are within normal limits. Anterior circulation: Both ICA siphons are patent. Moderate calcified plaque of the left siphon cavernous segment with associated moderate to severe stenosis in the distal cavernous segment (radiographic string sign series 6, image 93 and series 7, image 105 just proximal to the anterior genu. Supraclinoid left ICA remains patent and within normal limits. Mild to moderate right cavernous ICA calcified plaque and mild to moderate stenosis (series 5, image 109). Patent and negative supraclinoid right ICA. Patent carotid termini, MCA and ACA origins. Anterior communicating artery is diminutive or absent. Bilateral ACA branches are within normal limits. Left MCA M1 segment and bifurcation are patent without stenosis. Right MCA M1 segment and bifurcation are patent without stenosis. Bilateral MCA branches are within normal limits. Venous sinuses: Patent. Anatomic variants: Mildly dominant right vertebral artery. Review of the MIP images confirms the above findings  IMPRESSION: 1. Negative for large vessel occlusion. No core infarct detected by CT Perfusion. Stable and negative plain CT appearance of the brain. 2. 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 There is contralateral mild to moderate right ICA siphon plaque and stenosis. 3. But there is very little extracranial atherosclerosis, with no significant vertebral artery or cervical carotid stenosis. Electronically Signed: By: Odessa FlemingH  Hall M.D. On: 05/28/2020 18:32   CT HEAD CODE STROKE WO CONTRAST  Addendum Date: 05/28/2020   ADDENDUM REPORT: 05/28/2020 18:58 ADDENDUM: Study discussed by telephone with Dr. Fanny BienQuale on 05/28/2020 at 1837 hours. Electronically Signed   By: Odessa FlemingH  Hall M.D.   On: 05/28/2020 18:58   Result Date: 05/28/2020 CLINICAL DATA:  Code stroke. 59 year old female  with sudden onset left side weakness and heaviness. EXAM: CT HEAD WITHOUT CONTRAST CT ANGIOGRAPHY HEAD AND NECK CT PERFUSION BRAIN TECHNIQUE: Multidetector CT imaging of the head, and CTA head and neck was performed using the standard protocol during bolus administration of intravenous contrast. Multiplanar CT image reconstructions and MIPs were obtained to evaluate the vascular anatomy. Carotid stenosis measurements (when applicable) are obtained utilizing NASCET criteria, using the distal internal carotid diameter as the denominator. Multiphase CT imaging of the brain was performed following IV bolus contrast injection. Subsequent parametric perfusion maps were calculated using RAPID software. CONTRAST:  OMNIPAQUE IOHEXOL 350 MG/ML SOLN COMPARISON:  Head CT 05/26/2019. FINDINGS: CT HEAD FINDINGS Brain: Cerebral volume is stable and within normal limits for age. No midline shift, ventriculomegaly, mass effect, evidence of mass lesion, intracranial hemorrhage or evidence of cortically based acute infarction. Gray-white matter differentiation is within normal limits throughout  the brain. Chronic partially empty sella. No cortical encephalomalacia identified. Vascular: Calcified atherosclerosis at the skull base. No suspicious intracranial vascular hyperdensity. Skull: No acute osseous abnormality identified. Sinuses/Orbits: Visualized paranasal sinuses and mastoids are stable and well pneumatized. Other: Visualized orbits and scalp soft tissues are within normal limits. ASPECTS [facility] Stroke Program Early CT Score) Total score (0-10 with 10 being normal): 10 Review of the MIP images confirms the above findings CT Brain Perfusion Findings: ASPECTS: 10 CBF (<30%) Volume: 68mL Perfusion (Tmax>6.0s) volume: 68mL (anterior inferior left temporal lobe, likely artifact) Mismatch Volume: 76mL Infarction Location:Not applicable CTA NECK Skeleton: Absent dentition. Cervical disc and endplate degeneration. No acute osseous abnormality identified. Upper chest: Negative. Other neck: Negative. Aortic arch: 3 vessel arch configuration with mild great vessel origin atherosclerosis. Right carotid system: Soft and calcified plaque in the proximal brachiocephalic artery but no significant stenosis. Negative right CCA origin. Mildly tortuous right CCA. Calcified plaque at the right ICA origin without stenosis. Tortuous vessel distal to the bulb with a kinked appearance (series 9, image 22). Left carotid system: Mild plaque at the left CCA origin without stenosis. Tortuous left CCA. No significant plaque at the left carotid bifurcation or proximal ICA. Tortuous left ICA distal to the bulb similar to the right side with a kinked appearance. Vertebral arteries: Normal proximal right subclavian artery and right vertebral artery origin. The right vertebral artery is patent and within normal limits to the skull base. Mild plaque in the proximal left subclavian artery without stenosis. Normal left vertebral artery origin. Tortuous left V1 segment. The left vertebral is patent and within normal limits to the skull  base. CTA HEAD Posterior circulation: The distal right vertebral artery is mildly dominant especially beyond the left PICA origin. No distal vertebral stenosis. Patent vertebrobasilar junction and basilar artery without stenosis. The right AICA appears dominant. Normal SCA and PCA origins. Posterior communicating arteries are diminutive or absent. Bilateral PCA branches are within normal limits. Anterior circulation: Both ICA siphons are patent. Moderate calcified plaque of the left siphon cavernous segment with associated moderate to severe stenosis in the distal cavernous segment (radiographic string sign series 6, image 93 and series 7, image 105 just proximal to the anterior genu. Supraclinoid left ICA remains patent and within normal limits. Mild to moderate right cavernous ICA calcified plaque and mild to moderate stenosis (series 5, image 109). Patent and negative supraclinoid right ICA. Patent carotid termini, MCA and ACA origins. Anterior communicating artery is diminutive or absent. Bilateral ACA branches are within normal limits. Left MCA M1 segment and bifurcation are patent without stenosis. Right  MCA M1 segment and bifurcation are patent without stenosis. Bilateral MCA branches are within normal limits. Venous sinuses: Patent. Anatomic variants: Mildly dominant right vertebral artery. Review of the MIP images confirms the above findings IMPRESSION: 1. Negative for large vessel occlusion. No core infarct detected by CT Perfusion. Stable and negative plain CT appearance of the brain. 2. 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 There is contralateral mild to moderate right ICA siphon plaque and stenosis. 3. But there is very little extracranial atherosclerosis, with no significant vertebral artery or cervical carotid stenosis. Electronically Signed: By: Odessa Fleming M.D. On: 05/28/2020 18:32     Assessment and Plan:   1.  Wide-complex  tachycardia Concerning for nonsustained VT, unable to exclude other wide-complex rhythms such as aberrant tachycardia -No other PVCs for comparison, no other runs of nonsustained VT on telemetry She was asymptomatic at the time -As outpatient reports very rare episodes of lightheadedness but these are very infrequent and she is minimally symptomatic.  Unclear if these are related or other etiology. -Echocardiogram is pending -We will continue to monitor with telemetry -Consider outpatient ZIO monitor at discharge -Given coronary calcifications, risk factors including diabetes, we may need to consider ischemic work-up. Might be a good candidate for cardiac CTA outpatient given her size and high risk of attenuation artifact on Myoview  Aortic atherosclerosis Diabetes, prior smoking history Minimal disease noted in aortic arch on prior CT scan December 2020  Coronary calcification Noted in the LAD, High risk for underlying coronary disease given diabetes, former smoker Consider outpatient CTA -Hemoglobin A1c pending  Headache/left-sided weakness Significant intracranial calcific atherosclerotic disease - will need aspirin, high intensity statin   Total encounter time more than 110 minutes  Greater than 50% was spent in counseling and coordination of care with the patient    For questions or updates, please contact [facility] Please consult [email or web address] for contact info under    Signed, Julien Nordmann, MD  05/29/2020 5:30 PM

## 2020-05-30 ENCOUNTER — Inpatient Hospital Stay (HOSPITAL_COMMUNITY)
Admit: 2020-05-30 | Discharge: 2020-05-30 | Disposition: A | Discharge status: Home / Self Care | Payer: Self-pay | Attending: Internal Medicine | Admitting: Internal Medicine

## 2020-05-30 DIAGNOSIS — I340 Nonrheumatic mitral (valve) insufficiency: Secondary | ICD-10-CM

## 2020-05-30 LAB — ECHOCARDIOGRAM COMPLETE BUBBLE STUDY
AR max vel: 2.59 cm2
AV Area VTI: 2.36 cm2
AV Area mean vel: 2.29 cm2
AV Mean grad: 3 mmHg
AV Peak grad: 5.2 mmHg
Ao pk vel: 1.14 m/s
Area-P 1/2: 4.49 cm2
S' Lateral: 2.9 cm

## 2020-05-30 LAB — GLUCOSE, CAPILLARY
Glucose-Capillary: 171 mg/dL — ABNORMAL HIGH (ref 70–99)
Glucose-Capillary: 177 mg/dL — ABNORMAL HIGH (ref 70–99)
Glucose-Capillary: 195 mg/dL — ABNORMAL HIGH (ref 70–99)
Glucose-Capillary: 207 mg/dL — ABNORMAL HIGH (ref 70–99)
Glucose-Capillary: 225 mg/dL — ABNORMAL HIGH (ref 70–99)
Glucose-Capillary: 228 mg/dL — ABNORMAL HIGH (ref 70–99)

## 2020-05-30 LAB — HEMOGLOBIN A1C
Hgb A1c MFr Bld: 11.8 % — ABNORMAL HIGH (ref 4.8–5.6)
Mean Plasma Glucose: 292 mg/dL

## 2020-05-30 LAB — C-REACTIVE PROTEIN: CRP: 1.1 mg/dL — ABNORMAL HIGH (ref <1.0)

## 2020-05-30 LAB — SEDIMENTATION RATE: Sed Rate: 9 mm/hr (ref 0–30)

## 2020-05-30 MED ORDER — CARVEDILOL 3.125 MG PO TABS
6.2500 mg | ORAL_TABLET | Freq: Two times a day (BID) | ORAL | Status: DC
  Administered 2020-05-30: 6.25 mg via ORAL
  Filled 2020-05-30: qty 2

## 2020-05-30 MED ORDER — GABAPENTIN 400 MG PO CAPS
400.0000 mg | ORAL_CAPSULE | Freq: Three times a day (TID) | ORAL | Status: DC
  Administered 2020-05-30: 400 mg via ORAL
  Filled 2020-05-30: qty 1

## 2020-05-30 MED ORDER — LIVING WELL WITH DIABETES BOOK: 1.0000 | Freq: Once | 0 refills | Status: AC

## 2020-05-30 MED ORDER — ATORVASTATIN CALCIUM 40 MG PO TABS: 40.0000 mg | ORAL_TABLET | Freq: Every day | ORAL | 1 refills | Status: AC

## 2020-05-30 MED ORDER — GABAPENTIN 400 MG PO CAPS: 400.0000 mg | ORAL_CAPSULE | Freq: Three times a day (TID) | ORAL | 0 refills | Status: AC

## 2020-05-30 MED ORDER — INSULIN GLARGINE 100 UNIT/ML ~~LOC~~ SOLN
10.0000 [IU] | Freq: Every day | SUBCUTANEOUS | Status: DC
  Filled 2020-05-30: qty 0.1

## 2020-05-30 MED ORDER — INSULIN STARTER KIT- PEN NEEDLES (ENGLISH)
1.0000 | Freq: Once | Status: AC
  Administered 2020-05-30: 1
  Filled 2020-05-30: qty 1

## 2020-05-30 MED ORDER — GABAPENTIN 300 MG PO CAPS
300.0000 mg | ORAL_CAPSULE | Freq: Three times a day (TID) | ORAL | Status: DC
  Administered 2020-05-30: 300 mg via ORAL
  Filled 2020-05-30: qty 1

## 2020-05-30 MED ORDER — GABAPENTIN 300 MG PO CAPS: 300.0000 mg | ORAL_CAPSULE | Freq: Three times a day (TID) | ORAL | Status: DC

## 2020-05-30 MED ORDER — LIVING WELL WITH DIABETES BOOK
Freq: Once | Status: AC
  Filled 2020-05-30: qty 1

## 2020-05-30 MED ORDER — AMLODIPINE BESYLATE 10 MG PO TABS: 10.0000 mg | ORAL_TABLET | Freq: Every day | ORAL | 1 refills | Status: AC

## 2020-05-30 MED ORDER — INSULIN ASPART 100 UNIT/ML ~~LOC~~ SOLN
3.0000 [IU] | Freq: Three times a day (TID) | SUBCUTANEOUS | Status: DC
  Administered 2020-05-30: 3 [IU] via SUBCUTANEOUS
  Filled 2020-05-30: qty 1

## 2020-05-30 NOTE — Evaluation (Signed)
Physical Therapy Evaluation Patient Details Name: Valeda Corzine MRN: [id number] DOB: Mar 09, 1961 Today's Date: 05/30/2020   History of Present Illness  59 y.o. female with a hx of morbid obesity, pelvic inflammatory disease, cervical radiculopathy, diabetes, former smoker, aortic atherosclerosis, calcifications in the LAD, presenting with headache, weakness on the left. Imaging negative for acute infarct.  Clinical Impression  Patient received in bed, reports continued L LE weakness. She performed bed mobility with mod independence. She transfers with min guard, ambulated 100 feet with RW and min guard. Ambulated 10 feet without AD with poor balance and antalgic gait pattern. Recommend she continue use RW for home. Patient will continue to benefit from skilled PT while here to improve strength and functional independence.             Follow Up Recommendations Outpatient PT    Equipment Recommendations  Rolling walker with 5" wheels    Recommendations for Other Services       Precautions / Restrictions Precautions Precautions: Fall Restrictions Weight Bearing Restrictions: No      Mobility  Bed Mobility Overal bed mobility: Modified Independent Bed Mobility: Supine to Sit     Supine to sit: Modified independent (Device/Increase time) Sit to supine: Supervision   General bed mobility comments: use of bed rails, increased time  Transfers Overall transfer level: Needs assistance Equipment used: Rolling walker (2 wheeled) Transfers: Sit to/from Stand Sit to Stand: Min guard         General transfer comment: effortful and additional time to perform  Ambulation/Gait Ambulation/Gait assistance: Min guard Gait Distance (Feet): 100 Feet Assistive device: Rolling walker (2 wheeled) Gait Pattern/deviations: Step-through pattern;Decreased stride length Gait velocity: decreased   General Gait Details: patient steady with RW, did trial of 10 feet without RW patient  demonstrating antalgic gait pattern.  Stairs            Wheelchair Mobility    Modified Rankin (Stroke Patients Only) Modified Rankin (Stroke Patients Only) Pre-Morbid Rankin Score: No symptoms Modified Rankin: No significant disability     Balance Overall balance assessment: Modified Independent Sitting-balance support: Feet supported Sitting balance-Leahy Scale: Normal     Standing balance support: Bilateral upper extremity supported;During functional activity Standing balance-Leahy Scale: Fair Standing balance comment: patient steady with RW, no LOB                             Pertinent Vitals/Pain Pain Assessment: 0-10 Pain Score: 9  Pain Location: bilat hands (chronic, but typically less painful when taking more medication - 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 dose) Pain Descriptors / Indicators: Aching Pain Intervention(s): Monitored during session    Home Living Family/patient expects to be discharged to:: Private residence Living Arrangements: Children Available Help at Discharge: Family;Available 24 hours/day Type of Home: House Home Access: Level entry     Home Layout: Two level;Able to live on main level with bedroom/bathroom Home Equipment: Cane - single point;Grab bars - toilet      Prior Function Level of Independence: Independent with assistive device(s)         Comments: PRN SPC use for mobility "when I'm having balance issues", indep with ADL and IADL Tasks, no falls in 86mo     Hand Dominance   Dominant Hand: Left    Extremity/Trunk Assessment   Upper Extremity Assessment Upper Extremity Assessment: Defer to OT evaluation RUE Deficits / Details: shoulder flex 4-/5, elbow flex/ext 4/5, grip/pinch  4+/5, endorses decreased sensation to 3rd, 4th, and 5th digits RUE Sensation: decreased light touch LUE Deficits / Details: shoulder flex 4-/5, elbow flex/ext 4/5, grip/pinch 4+/5, endorses decreased sensation  to initially 1st and 2nd digits but now worsening to all 5 digits    Lower Extremity Assessment Lower Extremity Assessment: LLE deficits/detail RLE Deficits / Details: grossly 4/5, denies sensation deficits LLE Deficits / Details: patient reports her left LE feels funny. Sensation intact, but feels different than the right LE. 4/5 strength in L LE 5/5 in right LLE Sensation: decreased light touch LLE Coordination: decreased gross motor    Cervical / Trunk Assessment Cervical / Trunk Assessment: Normal  Communication   Communication: No difficulties  Cognition Arousal/Alertness: Awake/alert Behavior During Therapy: WFL for tasks assessed/performed Overall Cognitive Status: Within Functional Limits for tasks assessed                                        General Comments General comments (skin integrity, edema, etc.): Pt endorsed slight lightheadedness/whoozy with transition from sitting to standing which improved over time, reports this as chronic and intermittent; sitting BP 145/96, standing BP 143/89    Exercises Other Exercises Other Exercises: Pt instructed in RW mgt for ADL mobility, falls prevention strategies, AE/DME   Assessment/Plan    PT Assessment Patient needs continued PT services  PT Problem List Decreased strength;Decreased mobility;Decreased activity tolerance;Decreased balance;Decreased knowledge of use of DME;Decreased safety awareness       PT Treatment Interventions DME instruction;Therapeutic activities;Gait training;Therapeutic exercise;Patient/family education;Functional mobility training;Stair training;Balance training;Neuromuscular re-education    PT Goals (Current goals can be found in the Care Plan section)  Acute Rehab PT Goals Patient Stated Goal: go home and feel better PT Goal Formulation: With patient Time For Goal Achievement: 06/06/20 Potential to Achieve Goals: Good    Frequency Min 2X/week   Barriers to discharge         Co-evaluation               AM-PAC PT "6 Clicks" Mobility  Outcome Measure Help needed turning from your back to your side while in a flat bed without using bedrails?: A Little Help needed moving from lying on your back to sitting on the side of a flat bed without using bedrails?: A Little Help needed moving to and from a bed to a chair (including a wheelchair)?: A Little Help needed standing up from a chair using your arms (e.g., wheelchair or bedside chair)?: A Little Help needed to walk in hospital room?: A Little Help needed climbing 3-5 steps with a railing? : A Little 6 Click Score: 18    End of Session Equipment Utilized During Treatment: Gait belt Activity Tolerance: Patient tolerated treatment well Patient left: in bed;Other (comment) (SLP in room, patient left seated on edge of bed) Nurse Communication: Mobility status PT Visit Diagnosis: Muscle weakness (generalized) (M62.81);Other abnormalities of gait and mobility (R26.89);Difficulty in walking, not elsewhere classified (R26.2)    Time: 9476-5465 PT Time Calculation (min) (ACUTE ONLY): 20 min   Charges:   PT Evaluation $PT Eval Moderate Complexity: 1 Mod PT Treatments $Gait Training: 8-22 mins        Rasool Rommel, PT, GCS 05/30/20,10:59 AM

## 2020-05-30 NOTE — Evaluation (Addendum)
Clinical/Bedside Swallow Evaluation Patient Details  Name: Penny Medina MRN: [id number] Date of Birth: [birth year]  Today's Date: 05/30/2020 Time: SLP Start Time (ACUTE ONLY): 1035 SLP Stop Time (ACUTE ONLY): 1120 SLP Time Calculation (min) (ACUTE ONLY): 45 min  Past Medical History:  Past Medical History:  Diagnosis Date  . Diabetes mellitus without complication (HCC)   . Heart attack (HCC)   . Hypertension    Past Surgical History:  Past Surgical History:  Procedure Laterality Date  . CESAREAN SECTION    . TUBAL LIGATION     HPI:  Pt is a 59 year old female with a past medical history of hypertension, diabetes type 2, Obesity, coronary artery disease status post MI who presented to the emergency department with a chief complaints of left-sided weakness.  She awakened between 3 and 4 in the morning and noted severe left sided headache (above the left eye) with left sided heaviness.  Symptoms did not improve and patient presented for evaluation.  Per Neurology, pt is Alert, oriented, thought content appropriate.  Speech fluent with some word finding difficulties at times.  Able to follow 3 step commands without difficulty.  Pt is currently on a Regular diet.    Assessment / Plan / Recommendation Clinical Impression  Pt appears to present w/ adequate oropharyngeal phase swallow w/ No oropharyngeal phase dysphagia noted, No neuromuscular deficits noted.Pt consumed po trials w/ No overt, clinical s/s of aspiration during po trials. Pt appears at reduced risk for aspiration following general aspiration precautions. During po trials, pt consumed all consistencies w/ no overt coughing, decline in vocal quality, or change in respiratory presentation during/post trials. Oral phase appeared [facility] w/ timely bolus management, mastication, and control of bolus propulsion for A-P transfer for swallowing. Oral clearing achieved w/ all trial consistencies. OM Exam appeared [facility] w/ no unilateral weakness  noted. Speech Clear. Pt fed self w/ min setup support. Recommend continue a Regular consistency diet w/ well-Cut meats, moistened foods; Thin liquidsVIA CUP - pt does not use straws at home. Recommend general aspiration precautions, Pills WHOLE in Puree for safer, easier swallowing as pt described Larger pills causing difficulty to swallow. Education given on Pills in Puree; food consistencies and easy to eat options; general aspiration precautions. NSG to reconsult if any new needs arise. Pt agreed. Re: cognitive-linguistic skills, pt conversed in conversation w/ SLP w/ appropriate speech - no receptive/expressive language deficits noted. She followed instructions appropriately. She denied any deficits at this time. SLP Visit Diagnosis: Dysphagia, unspecified (R13.10)    Aspiration Risk  No limitations (reduced following precautions)    Diet Recommendation  Regular diet w/ thin liquids; general aspiration precautions  Medication Administration: Whole meds with liquid (but Whole in puree if any difficulty w/ larger pills)    Other  Recommendations Oral Care Recommendations: Oral care BID;Oral care before and after PO;Patient independent with oral care Other Recommendations:  (n/a)   Follow up Recommendations None      Frequency and Duration  (n/a)   (n/a)       Prognosis Prognosis for Safe Diet Advancement: Good Barriers to Reach Goals:  (n/a)      Swallow Study   General Date of Onset: 05/28/20 HPI: Pt is a 59 year old female with a past medical history of hypertension, diabetes type 2, Obesity, coronary artery disease status post MI who presented to the emergency department with a chief complaints of left-sided weakness.  She awakened between 3 and 4 in the morning and noted severe left sided headache (  above the left eye) with left sided heaviness.  Symptoms did not improve and patient presented for evaluation.  Per Neurology, pt is Alert, oriented, thought content appropriate.  Speech  fluent with some word finding difficulties at times.  Able to follow 3 step commands without difficulty.  Pt is currently on a Regular diet.  Type of Study: Bedside Swallow Evaluation Previous Swallow Assessment: none Diet Prior to this Study: Regular;Thin liquids Temperature Spikes Noted: No (wbc 5.6) Respiratory Status: Room air History of Recent Intubation: No Behavior/Cognition: Alert;Cooperative;Pleasant mood Oral Cavity Assessment: Within Functional Limits Oral Care Completed by SLP: Recent completion by staff Oral Cavity - Dentition: Dentures, top;Dentures, bottom Vision: Functional for self-feeding Self-Feeding Abilities: Able to feed self Patient Positioning: Upright in bed (EOB) Baseline Vocal Quality: Normal Volitional Cough: Strong Volitional Swallow: Able to elicit    Oral/Motor/Sensory Function Overall Oral Motor/Sensory Function: Within functional limits   Ice Chips Ice chips: Not tested   Thin Liquid Thin Liquid: Within functional limits Presentation: Cup;Self Fed;Straw (8+ trials)    Nectar Thick Nectar Thick Liquid: Not tested   Honey Thick Honey Thick Liquid: Not tested   Puree Puree: Within functional limits Presentation: Self Fed;Spoon (6+ trials)   Solid     Solid: Within functional limits Presentation: Self Fed (5+ trials)        Jerilynn Som, MS, CCC-SLP Speech Language Pathologist Rehab Services [phone] Penny Medina 05/30/2020,1:26 PM

## 2020-05-30 NOTE — Consult Note (Signed)
[facility] Vascular Consult Note  MRN : [id number]  Sheri Gatchel is a 59 y.o. (1961/05/30) female who presents with chief complaint of  Chief Complaint  Patient presents with  . Weakness   History of Present Illness:  The patient is a 59 year old female with a past medical history of hypertension, diabetes type 2, obesity, coronary artery disease status post MI who presented to the [facility] emergency department with a chief complaints of left-sided weakness.  Went to bed at about 2200.  Awakened between 3 and 4 this morning and noted severe left sided headache (above the left eye) with left sided heaviness.  Symptoms did not improve and patient presented for evaluation.   CTA Head / Neck: 1. Negative for large vessel occlusion. No core infarct detected by CT Perfusion. Stable and negative plain CT appearance of the brain.  2. 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 There is contralateral mild to moderate right ICA siphon plaque and stenosis.  3. But there is very little extracranial atherosclerosis, with no significant vertebral artery or cervical carotid stenosis.  Vascular surgery was consulted by Dr. Marylu Lund for possible intervention.  Current Facility-Administered Medications  Medication Dose Route Frequency Provider Last Rate Last Admin  . 0.9 %  sodium chloride infusion   Intravenous Continuous Therisa Doyne, MD 75 mL/hr at 05/30/20 0718 New Bag at 05/30/20 0718  . acetaminophen (TYLENOL) tablet 650 mg  650 mg Oral Q4H PRN Therisa Doyne, MD       Or  . acetaminophen (TYLENOL) 160 MG/5ML solution 650 mg  650 mg Per Tube Q4H PRN Doutova, Anastassia, MD       Or  . acetaminophen (TYLENOL) suppository 650 mg  650 mg Rectal Q4H PRN Doutova, Anastassia, MD      . amLODipine (NORVASC) tablet 5 mg  5 mg Oral Daily Lynn Ito, MD   5 mg at 05/30/20  0843  . aspirin suppository 300 mg  300 mg Rectal Daily Doutova, Anastassia, MD       Or  . aspirin tablet 325 mg  325 mg Oral Daily Doutova, Anastassia, MD   325 mg at 05/30/20 0843  . atorvastatin (LIPITOR) tablet 40 mg  40 mg Oral Daily Antonieta Iba, MD   40 mg at 05/29/20 1822  . enoxaparin (LOVENOX) injection 40 mg  40 mg Subcutaneous Q24H Lynn Ito, MD   40 mg at 05/29/20 2311  . gabapentin (NEURONTIN) capsule 400 mg  400 mg Oral TID Thana Farr, MD      . insulin aspart (novoLOG) injection 0-9 Units  0-9 Units Subcutaneous Q4H Therisa Doyne, MD   2 Units at 05/30/20 0844   Past Medical History:  Diagnosis Date  . Diabetes mellitus without complication (HCC)   . Heart attack (HCC)   . Hypertension    Past Surgical History:  Procedure Laterality Date  . CESAREAN SECTION    . TUBAL LIGATION     Social History Social History   Tobacco Use  . Smoking status: Former Games developer  . Smokeless tobacco: Never Used  Substance Use Topics  . Alcohol use: No  . Drug use: Never   Family History Family History  Problem Relation Age of Onset  . Diabetes Mother   . CVA Mother   . Hypertension Father   Denies family history of heart disease, venous disease or renal disease.  Allergies  Allergen Reactions  . Penicillins  Patient was told by MD in 1986 not to take PCN any longer (due to being allergic) Unknown reaction per patient  . Sulfa Antibiotics Rash   REVIEW OF SYSTEMS (Negative unless checked)  Constitutional: [] Weight loss  [] Fever  [] Chills Cardiac: [] Chest pain   [] Chest pressure   [] Palpitations   [] Shortness of breath when laying flat   [] Shortness of breath at rest   [] Shortness of breath with exertion. Vascular:  [] Pain in legs with walking   [] Pain in legs at rest   [] Pain in legs when laying flat   [] Claudication   [] Pain in feet when walking  [] Pain in feet at rest  [] Pain in feet when laying flat   [] History of DVT   [] Phlebitis   [] Swelling in  legs   [] Varicose veins   [] Non-healing ulcers Pulmonary:   [] Uses home oxygen   [] Productive cough   [] Hemoptysis   [] Wheeze  [] COPD   [] Asthma Neurologic:  [] Dizziness  [] Blackouts   [] Seizures   [] History of stroke   [] History of TIA  [] Aphasia   [] Temporary blindness   [] Dysphagia   [x] Weakness or numbness in arms   [x] Weakness or numbness in legs Musculoskeletal:  [] Arthritis   [] Joint swelling   [] Joint pain   [] Low back pain Hematologic:  [] Easy bruising  [] Easy bleeding   [] Hypercoagulable state   [] Anemic  [] Hepatitis Gastrointestinal:  [] Blood in stool   [] Vomiting blood  [] Gastroesophageal reflux/heartburn   [] Difficulty swallowing. Genitourinary:  [] Chronic kidney disease   [] Difficult urination  [] Frequent urination  [] Burning with urination   [] Blood in urine Skin:  [] Rashes   [] Ulcers   [] Wounds Psychological:  [] History of anxiety   []  History of major depression.  Physical Examination  Vitals:   05/29/20 1959 05/29/20 2355 05/29/20 2358 05/30/20 0748  BP: (!) 158/83 138/73 138/73 (!) 155/97  Pulse: 71 70 68 76  Resp: 17 19 19 15   Temp: 98.8 F (37.1 C) 98.1 F (36.7 C) 98.1 F (36.7 C) 98 F (36.7 C)  TempSrc: Oral Oral Oral Oral  SpO2: 97% 98% 98% 99%  Weight:      Height:       Body mass index is 43.36 kg/m. Gen:  WD/WN, NAD Head: [place]/AT, No temporalis wasting. Prominent temp pulse not noted. Ear/Nose/Throat: Hearing grossly intact, nares w/o erythema or drainage, oropharynx w/o Erythema/Exudate Eyes: Sclera non-icteric, conjunctiva clear Neck: Trachea midline.  No JVD.  Pulmonary:  Good air movement, respirations not labored, equal bilaterally.  Cardiac: RRR, normal S1, S2. Vascular:  Vessel Right Left  Radial Palpable Palpable  Ulnar Palpable Palpable  Brachial Palpable Palpable  Carotid Palpable, without bruit Palpable, without bruit  Aorta Not palpable N/A  Femoral Palpable Palpable  Popliteal Palpable Palpable  PT Palpable Palpable  DP Palpable  Palpable   Gastrointestinal: soft, non-tender/non-distended. No guarding/reflex.  Musculoskeletal: M/S 5/5 throughout.  Extremities without ischemic changes.  No deformity or atrophy. No edema. Neurologic: Sensation grossly intact in extremities.  Symmetrical.  Speech is fluent. Motor exam as listed above. Psychiatric: Judgment intact, Mood & affect appropriate for pt's clinical situation. Dermatologic: No rashes or ulcers noted.  No cellulitis or open wounds. Lymph : No Cervical, Axillary, or Inguinal lymphadenopathy.  CBC Lab Results  Component Value Date   WBC 5.6 05/29/2020   HGB 11.8 (L) 05/29/2020   HCT 39.9 05/29/2020   MCV 67.4 (L) 05/29/2020   PLT 178 05/29/2020   BMET    Component Value Date/Time   NA 138 05/28/2020  1730   K 3.8 05/28/2020 1730   CL 100 05/28/2020 1730   CO2 27 05/28/2020 1730   GLUCOSE 215 (H) 05/28/2020 1730   BUN 6 05/28/2020 1730   CREATININE 1.07 (H) 05/29/2020 1623   CALCIUM 9.8 05/28/2020 1730   GFRNONAA 58 (L) 05/29/2020 1623   GFRAA >60 07/26/2019 0609   Estimated Creatinine Clearance: 74.6 mL/min (A) (by C-G formula based on SCr of 1.07 mg/dL (H)).  COAG Lab Results  Component Value Date   INR 1.0 05/28/2020   INR 1.00 10/15/2017   Radiology CT Angio Head W or Wo Contrast  Addendum Date: 05/28/2020   ADDENDUM REPORT: 05/28/2020 18:58 ADDENDUM: Study discussed by telephone with Dr. Fanny Bien on 05/28/2020 at 1837 hours. Electronically Signed   By: Odessa Fleming M.D.   On: 05/28/2020 18:58   Result Date: 05/28/2020 CLINICAL DATA:  Code stroke. 59 year old female with sudden onset left side weakness and heaviness. EXAM: CT HEAD WITHOUT CONTRAST CT ANGIOGRAPHY HEAD AND NECK CT PERFUSION BRAIN TECHNIQUE: Multidetector CT imaging of the head, and CTA head and neck was performed using the standard protocol during bolus administration of intravenous contrast. Multiplanar CT image reconstructions and MIPs were obtained to evaluate the vascular anatomy.  Carotid stenosis measurements (when applicable) are obtained utilizing NASCET criteria, using the distal internal carotid diameter as the denominator. Multiphase CT imaging of the brain was performed following IV bolus contrast injection. Subsequent parametric perfusion maps were calculated using RAPID software. CONTRAST:  OMNIPAQUE IOHEXOL 350 MG/ML SOLN COMPARISON:  Head CT 05/26/2019. FINDINGS: CT HEAD FINDINGS Brain: Cerebral volume is stable and within normal limits for age. No midline shift, ventriculomegaly, mass effect, evidence of mass lesion, intracranial hemorrhage or evidence of cortically based acute infarction. Gray-white matter differentiation is within normal limits throughout the brain. Chronic partially empty sella. No cortical encephalomalacia identified. Vascular: Calcified atherosclerosis at the skull base. No suspicious intracranial vascular hyperdensity. Skull: No acute osseous abnormality identified. Sinuses/Orbits: Visualized paranasal sinuses and mastoids are stable and well pneumatized. Other: Visualized orbits and scalp soft tissues are within normal limits. ASPECTS [facility] Stroke Program Early CT Score) Total score (0-10 with 10 being normal): 10 Review of the MIP images confirms the above findings CT Brain Perfusion Findings: ASPECTS: 10 CBF (<30%) Volume: 0mL Perfusion (Tmax>6.0s) volume: 3mL (anterior inferior left temporal lobe, likely artifact) Mismatch Volume: 3mL Infarction Location:Not applicable CTA NECK Skeleton: Absent dentition. Cervical disc and endplate degeneration. No acute osseous abnormality identified. Upper chest: Negative. Other neck: Negative. Aortic arch: 3 vessel arch configuration with mild great vessel origin atherosclerosis. Right carotid system: Soft and calcified plaque in the proximal brachiocephalic artery but no significant stenosis. Negative right CCA origin. Mildly tortuous right CCA. Calcified plaque at the right ICA origin without stenosis.  Tortuous vessel distal to the bulb with a kinked appearance (series 9, image 22). Left carotid system: Mild plaque at the left CCA origin without stenosis. Tortuous left CCA. No significant plaque at the left carotid bifurcation or proximal ICA. Tortuous left ICA distal to the bulb similar to the right side with a kinked appearance. Vertebral arteries: Normal proximal right subclavian artery and right vertebral artery origin. The right vertebral artery is patent and within normal limits to the skull base. Mild plaque in the proximal left subclavian artery without stenosis. Normal left vertebral artery origin. Tortuous left V1 segment. The left vertebral is patent and within normal limits to the skull base. CTA HEAD Posterior circulation: The distal right vertebral artery is  mildly dominant especially beyond the left PICA origin. No distal vertebral stenosis. Patent vertebrobasilar junction and basilar artery without stenosis. The right AICA appears dominant. Normal SCA and PCA origins. Posterior communicating arteries are diminutive or absent. Bilateral PCA branches are within normal limits. Anterior circulation: Both ICA siphons are patent. Moderate calcified plaque of the left siphon cavernous segment with associated moderate to severe stenosis in the distal cavernous segment (radiographic string sign series 6, image 93 and series 7, image 105 just proximal to the anterior genu. Supraclinoid left ICA remains patent and within normal limits. Mild to moderate right cavernous ICA calcified plaque and mild to moderate stenosis (series 5, image 109). Patent and negative supraclinoid right ICA. Patent carotid termini, MCA and ACA origins. Anterior communicating artery is diminutive or absent. Bilateral ACA branches are within normal limits. Left MCA M1 segment and bifurcation are patent without stenosis. Right MCA M1 segment and bifurcation are patent without stenosis. Bilateral MCA branches are within normal limits.  Venous sinuses: Patent. Anatomic variants: Mildly dominant right vertebral artery. Review of the MIP images confirms the above findings IMPRESSION: 1. Negative for large vessel occlusion. No core infarct detected by CT Perfusion. Stable and negative plain CT appearance of the brain. 2. 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 There is contralateral mild to moderate right ICA siphon plaque and stenosis. 3. But there is very little extracranial atherosclerosis, with no significant vertebral artery or cervical carotid stenosis. Electronically Signed: By: Odessa Fleming M.D. On: 05/28/2020 18:32   DG Chest 2 View  Result Date: 05/28/2020 CLINICAL DATA:  Left-sided weakness and headache.  Hypertension EXAM: CHEST - 2 VIEW COMPARISON:  CT angiogram chest July 26, 2019 FINDINGS: Lungs are clear. There is cardiomegaly with pulmonary vascularity normal. No adenopathy. No bone lesions. IMPRESSION: Cardiomegaly.  Lungs clear.  No adenopathy evident. Electronically Signed   By: Bretta Bang III M.D.   On: 05/28/2020 19:27   CT Angio Neck W and/or Wo Contrast  Addendum Date: 05/28/2020   ADDENDUM REPORT: 05/28/2020 18:58 ADDENDUM: Study discussed by telephone with Dr. Fanny Bien on 05/28/2020 at 1837 hours. Electronically Signed   By: Odessa Fleming M.D.   On: 05/28/2020 18:58   Result Date: 05/28/2020 CLINICAL DATA:  Code stroke. 59 year old female with sudden onset left side weakness and heaviness. EXAM: CT HEAD WITHOUT CONTRAST CT ANGIOGRAPHY HEAD AND NECK CT PERFUSION BRAIN TECHNIQUE: Multidetector CT imaging of the head, and CTA head and neck was performed using the standard protocol during bolus administration of intravenous contrast. Multiplanar CT image reconstructions and MIPs were obtained to evaluate the vascular anatomy. Carotid stenosis measurements (when applicable) are obtained utilizing NASCET criteria, using the distal internal carotid diameter  as the denominator. Multiphase CT imaging of the brain was performed following IV bolus contrast injection. Subsequent parametric perfusion maps were calculated using RAPID software. CONTRAST:  OMNIPAQUE IOHEXOL 350 MG/ML SOLN COMPARISON:  Head CT 05/26/2019. FINDINGS: CT HEAD FINDINGS Brain: Cerebral volume is stable and within normal limits for age. No midline shift, ventriculomegaly, mass effect, evidence of mass lesion, intracranial hemorrhage or evidence of cortically based acute infarction. Gray-white matter differentiation is within normal limits throughout the brain. Chronic partially empty sella. No cortical encephalomalacia identified. Vascular: Calcified atherosclerosis at the skull base. No suspicious intracranial vascular hyperdensity. Skull: No acute osseous abnormality identified. Sinuses/Orbits: Visualized paranasal sinuses and mastoids are stable and well pneumatized. Other: Visualized orbits and scalp soft tissues are  within normal limits. ASPECTS [facility] Stroke Program Early CT Score) Total score (0-10 with 10 being normal): 10 Review of the MIP images confirms the above findings CT Brain Perfusion Findings: ASPECTS: 10 CBF (<30%) Volume: 0mL Perfusion (Tmax>6.0s) volume: 3mL (anterior inferior left temporal lobe, likely artifact) Mismatch Volume: 3mL Infarction Location:Not applicable CTA NECK Skeleton: Absent dentition. Cervical disc and endplate degeneration. No acute osseous abnormality identified. Upper chest: Negative. Other neck: Negative. Aortic arch: 3 vessel arch configuration with mild great vessel origin atherosclerosis. Right carotid system: Soft and calcified plaque in the proximal brachiocephalic artery but no significant stenosis. Negative right CCA origin. Mildly tortuous right CCA. Calcified plaque at the right ICA origin without stenosis. Tortuous vessel distal to the bulb with a kinked appearance (series 9, image 22). Left carotid system: Mild plaque at the left CCA  origin without stenosis. Tortuous left CCA. No significant plaque at the left carotid bifurcation or proximal ICA. Tortuous left ICA distal to the bulb similar to the right side with a kinked appearance. Vertebral arteries: Normal proximal right subclavian artery and right vertebral artery origin. The right vertebral artery is patent and within normal limits to the skull base. Mild plaque in the proximal left subclavian artery without stenosis. Normal left vertebral artery origin. Tortuous left V1 segment. The left vertebral is patent and within normal limits to the skull base. CTA HEAD Posterior circulation: The distal right vertebral artery is mildly dominant especially beyond the left PICA origin. No distal vertebral stenosis. Patent vertebrobasilar junction and basilar artery without stenosis. The right AICA appears dominant. Normal SCA and PCA origins. Posterior communicating arteries are diminutive or absent. Bilateral PCA branches are within normal limits. Anterior circulation: Both ICA siphons are patent. Moderate calcified plaque of the left siphon cavernous segment with associated moderate to severe stenosis in the distal cavernous segment (radiographic string sign series 6, image 93 and series 7, image 105 just proximal to the anterior genu. Supraclinoid left ICA remains patent and within normal limits. Mild to moderate right cavernous ICA calcified plaque and mild to moderate stenosis (series 5, image 109). Patent and negative supraclinoid right ICA. Patent carotid termini, MCA and ACA origins. Anterior communicating artery is diminutive or absent. Bilateral ACA branches are within normal limits. Left MCA M1 segment and bifurcation are patent without stenosis. Right MCA M1 segment and bifurcation are patent without stenosis. Bilateral MCA branches are within normal limits. Venous sinuses: Patent. Anatomic variants: Mildly dominant right vertebral artery. Review of the MIP images confirms the above  findings IMPRESSION: 1. Negative for large vessel occlusion. No core infarct detected by CT Perfusion. Stable and negative plain CT appearance of the brain. 2. 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 There is contralateral mild to moderate right ICA siphon plaque and stenosis. 3. But there is very little extracranial atherosclerosis, with no significant vertebral artery or cervical carotid stenosis. Electronically Signed: By: Odessa Fleming M.D. On: 05/28/2020 18:32   MR BRAIN WO CONTRAST  Result Date: 05/29/2020 CLINICAL DATA:  Neuro deficit, acute, stroke suspected EXAM: MRI HEAD WITHOUT CONTRAST TECHNIQUE: Multiplanar, multiecho pulse sequences of the brain and surrounding structures were obtained without intravenous contrast. COMPARISON:  Brain MRI 05/29/2020 at 1:40 p.m. FINDINGS: Brain: No acute infarct, acute hemorrhage or extra-axial collection. A few scattered foci of T2WI-hyperintensity in the white matter, nonspecific. Normal volume of CSF spaces. No chronic microhemorrhage. Normal midline structures. Vascular: Normal flow voids. Skull and upper cervical  spine: Normal marrow signal. Sinuses/Orbits: Negative. Other: None. IMPRESSION: Normal MRI of the brain. Electronically Signed   By: Deatra Robinson M.D.   On: 05/29/2020 22:56   MR BRAIN WO CONTRAST  Result Date: 05/29/2020 CLINICAL DATA:  Abnormal speech, left-sided weakness EXAM: MRI HEAD WITHOUT CONTRAST TECHNIQUE: Multiplanar, multiecho pulse sequences of the brain and surrounding structures were obtained without intravenous contrast. COMPARISON:  None. FINDINGS: Brain: There is no acute infarction or intracranial hemorrhage. There is no intracranial mass, mass effect, or edema. There is no hydrocephalus or extra-axial fluid collection. There are minimal small foci of T2 hyperintensity in the supratentorial white matter likely reflecting nonspecific gliosis/demyelination potentially  due to chronic microvascular ischemic changes in a patient of this age. Ventricles and sulci are normal in size and configuration. Vascular: Major vessel flow voids at the skull base are preserved. Skull and upper cervical spine: Normal marrow signal is preserved. Sinuses/Orbits: Paranasal sinuses are aerated. Orbits are unremarkable. Other: Sella is partially empty.  Mastoid air cells are clear. IMPRESSION: No evidence of recent infarction, hemorrhage, or mass. Probable minor chronic microvascular ischemic changes. Electronically Signed   By: Guadlupe Spanish M.D.   On: 05/29/2020 14:19   MR CERVICAL SPINE WO CONTRAST  Result Date: 05/29/2020 CLINICAL DATA:  Low back pain and progressive neurologic deficits. EXAM: MRI CERVICAL, THORACIC AND LUMBAR SPINE WITHOUT CONTRAST TECHNIQUE: Multiplanar and multiecho pulse sequences of the cervical spine, to include the craniocervical junction and cervicothoracic junction, and thoracic and lumbar spine, were obtained without intravenous contrast. COMPARISON:  Cervical spine MRI 09/05/2019 FINDINGS: MRI CERVICAL SPINE FINDINGS Alignment: Grade 1 retrolisthesis at C5-6. Straightening of normal cervical lordosis. Vertebrae: No acute abnormality. Cord: Hyperintense T2-weighted signal within the spinal cord at the C4-5 level, unchanged. Posterior Fossa, vertebral arteries, paraspinal tissues: Negative. Disc levels: C1-2: Unremarkable. C2-3: Normal disc space and facet joints. There is no spinal canal stenosis. No neural foraminal stenosis. C3-4: Normal disc space and facet joints. There is no spinal canal stenosis. No neural foraminal stenosis. C4-5: Intermediate sized disc bulge. Moderate spinal canal stenosis. Mild right neural foraminal stenosis. C5-6: Right-greater-than-left uncovertebral hypertrophy and small disc bulge. There is no spinal canal stenosis. Moderate right and mild left neural foraminal stenosis. C6-7: Right uncovertebral hypertrophy. There is no spinal canal  stenosis. Moderate right neural foraminal stenosis. C7-T1: Normal disc space and facet joints. There is no spinal canal stenosis. No neural foraminal stenosis. MRI THORACIC SPINE FINDINGS Alignment:  Physiologic. Vertebrae: No fracture, evidence of discitis, or bone lesion. Cord:  Normal signal and morphology. Paraspinal and other soft tissues: Negative. Disc levels: Mild degenerative disc disease without spinal canal or neural foraminal stenosis. MRI LUMBAR SPINE FINDINGS Segmentation:  Standard. Alignment:  Physiologic. Vertebrae:  No fracture, evidence of discitis, or bone lesion. Conus medullaris and cauda equina: Conus extends to the L1 level. Conus and cauda equina appear normal. Paraspinal and other soft tissues: Negative. Disc levels: Disc levels above L5 are normal. L5-S1: Disc space narrowing with endplate spurring and intermediate left asymmetric bulge. There is narrowing of the lateral recesses and severe left foraminal stenosis. Mild right foraminal stenosis. IMPRESSION: 1. No acute abnormality of the cervical, thoracic or lumbar spine. 2. Unchanged hyperintense T2-weighted signal within the spinal cord at the C4-5 level is consistent with myelomalacia. 3. Moderate right C5-6 and C6-7 neural foraminal stenosis. 4. Left asymmetric disc bulge at L5-S1 with narrowing of the lateral recesses and severe left neural foraminal stenosis. Electronically Signed   By: Caryn Bee  Chase Picket M.D.   On: 05/29/2020 22:42   MR THORACIC SPINE WO CONTRAST  Result Date: 05/29/2020 CLINICAL DATA:  Low back pain and progressive neurologic deficits. EXAM: MRI CERVICAL, THORACIC AND LUMBAR SPINE WITHOUT CONTRAST TECHNIQUE: Multiplanar and multiecho pulse sequences of the cervical spine, to include the craniocervical junction and cervicothoracic junction, and thoracic and lumbar spine, were obtained without intravenous contrast. COMPARISON:  Cervical spine MRI 09/05/2019 FINDINGS: MRI CERVICAL SPINE FINDINGS Alignment: Grade 1  retrolisthesis at C5-6. Straightening of normal cervical lordosis. Vertebrae: No acute abnormality. Cord: Hyperintense T2-weighted signal within the spinal cord at the C4-5 level, unchanged. Posterior Fossa, vertebral arteries, paraspinal tissues: Negative. Disc levels: C1-2: Unremarkable. C2-3: Normal disc space and facet joints. There is no spinal canal stenosis. No neural foraminal stenosis. C3-4: Normal disc space and facet joints. There is no spinal canal stenosis. No neural foraminal stenosis. C4-5: Intermediate sized disc bulge. Moderate spinal canal stenosis. Mild right neural foraminal stenosis. C5-6: Right-greater-than-left uncovertebral hypertrophy and small disc bulge. There is no spinal canal stenosis. Moderate right and mild left neural foraminal stenosis. C6-7: Right uncovertebral hypertrophy. There is no spinal canal stenosis. Moderate right neural foraminal stenosis. C7-T1: Normal disc space and facet joints. There is no spinal canal stenosis. No neural foraminal stenosis. MRI THORACIC SPINE FINDINGS Alignment:  Physiologic. Vertebrae: No fracture, evidence of discitis, or bone lesion. Cord:  Normal signal and morphology. Paraspinal and other soft tissues: Negative. Disc levels: Mild degenerative disc disease without spinal canal or neural foraminal stenosis. MRI LUMBAR SPINE FINDINGS Segmentation:  Standard. Alignment:  Physiologic. Vertebrae:  No fracture, evidence of discitis, or bone lesion. Conus medullaris and cauda equina: Conus extends to the L1 level. Conus and cauda equina appear normal. Paraspinal and other soft tissues: Negative. Disc levels: Disc levels above L5 are normal. L5-S1: Disc space narrowing with endplate spurring and intermediate left asymmetric bulge. There is narrowing of the lateral recesses and severe left foraminal stenosis. Mild right foraminal stenosis. IMPRESSION: 1. No acute abnormality of the cervical, thoracic or lumbar spine. 2. Unchanged hyperintense T2-weighted  signal within the spinal cord at the C4-5 level is consistent with myelomalacia. 3. Moderate right C5-6 and C6-7 neural foraminal stenosis. 4. Left asymmetric disc bulge at L5-S1 with narrowing of the lateral recesses and severe left neural foraminal stenosis. Electronically Signed   By: Deatra Robinson M.D.   On: 05/29/2020 22:42   MR LUMBAR SPINE WO CONTRAST  Result Date: 05/29/2020 CLINICAL DATA:  Low back pain and progressive neurologic deficits. EXAM: MRI CERVICAL, THORACIC AND LUMBAR SPINE WITHOUT CONTRAST TECHNIQUE: Multiplanar and multiecho pulse sequences of the cervical spine, to include the craniocervical junction and cervicothoracic junction, and thoracic and lumbar spine, were obtained without intravenous contrast. COMPARISON:  Cervical spine MRI 09/05/2019 FINDINGS: MRI CERVICAL SPINE FINDINGS Alignment: Grade 1 retrolisthesis at C5-6. Straightening of normal cervical lordosis. Vertebrae: No acute abnormality. Cord: Hyperintense T2-weighted signal within the spinal cord at the C4-5 level, unchanged. Posterior Fossa, vertebral arteries, paraspinal tissues: Negative. Disc levels: C1-2: Unremarkable. C2-3: Normal disc space and facet joints. There is no spinal canal stenosis. No neural foraminal stenosis. C3-4: Normal disc space and facet joints. There is no spinal canal stenosis. No neural foraminal stenosis. C4-5: Intermediate sized disc bulge. Moderate spinal canal stenosis. Mild right neural foraminal stenosis. C5-6: Right-greater-than-left uncovertebral hypertrophy and small disc bulge. There is no spinal canal stenosis. Moderate right and mild left neural foraminal stenosis. C6-7: Right uncovertebral hypertrophy. There is no spinal canal stenosis. Moderate right  neural foraminal stenosis. C7-T1: Normal disc space and facet joints. There is no spinal canal stenosis. No neural foraminal stenosis. MRI THORACIC SPINE FINDINGS Alignment:  Physiologic. Vertebrae: No fracture, evidence of discitis, or  bone lesion. Cord:  Normal signal and morphology. Paraspinal and other soft tissues: Negative. Disc levels: Mild degenerative disc disease without spinal canal or neural foraminal stenosis. MRI LUMBAR SPINE FINDINGS Segmentation:  Standard. Alignment:  Physiologic. Vertebrae:  No fracture, evidence of discitis, or bone lesion. Conus medullaris and cauda equina: Conus extends to the L1 level. Conus and cauda equina appear normal. Paraspinal and other soft tissues: Negative. Disc levels: Disc levels above L5 are normal. L5-S1: Disc space narrowing with endplate spurring and intermediate left asymmetric bulge. There is narrowing of the lateral recesses and severe left foraminal stenosis. Mild right foraminal stenosis. IMPRESSION: 1. No acute abnormality of the cervical, thoracic or lumbar spine. 2. Unchanged hyperintense T2-weighted signal within the spinal cord at the C4-5 level is consistent with myelomalacia. 3. Moderate right C5-6 and C6-7 neural foraminal stenosis. 4. Left asymmetric disc bulge at L5-S1 with narrowing of the lateral recesses and severe left neural foraminal stenosis. Electronically Signed   By: Deatra Robinson M.D.   On: 05/29/2020 22:42   CT CEREBRAL PERFUSION W CONTRAST  Addendum Date: 05/28/2020   ADDENDUM REPORT: 05/28/2020 18:58 ADDENDUM: Study discussed by telephone with Dr. Fanny Bien on 05/28/2020 at 1837 hours. Electronically Signed   By: Odessa Fleming M.D.   On: 05/28/2020 18:58   Result Date: 05/28/2020 CLINICAL DATA:  Code stroke. 59 year old female with sudden onset left side weakness and heaviness. EXAM: CT HEAD WITHOUT CONTRAST CT ANGIOGRAPHY HEAD AND NECK CT PERFUSION BRAIN TECHNIQUE: Multidetector CT imaging of the head, and CTA head and neck was performed using the standard protocol during bolus administration of intravenous contrast. Multiplanar CT image reconstructions and MIPs were obtained to evaluate the vascular anatomy. Carotid stenosis measurements (when applicable) are  obtained utilizing NASCET criteria, using the distal internal carotid diameter as the denominator. Multiphase CT imaging of the brain was performed following IV bolus contrast injection. Subsequent parametric perfusion maps were calculated using RAPID software. CONTRAST:  OMNIPAQUE IOHEXOL 350 MG/ML SOLN COMPARISON:  Head CT 05/26/2019. FINDINGS: CT HEAD FINDINGS Brain: Cerebral volume is stable and within normal limits for age. No midline shift, ventriculomegaly, mass effect, evidence of mass lesion, intracranial hemorrhage or evidence of cortically based acute infarction. Gray-white matter differentiation is within normal limits throughout the brain. Chronic partially empty sella. No cortical encephalomalacia identified. Vascular: Calcified atherosclerosis at the skull base. No suspicious intracranial vascular hyperdensity. Skull: No acute osseous abnormality identified. Sinuses/Orbits: Visualized paranasal sinuses and mastoids are stable and well pneumatized. Other: Visualized orbits and scalp soft tissues are within normal limits. ASPECTS [facility] Stroke Program Early CT Score) Total score (0-10 with 10 being normal): 10 Review of the MIP images confirms the above findings CT Brain Perfusion Findings: ASPECTS: 10 CBF (<30%) Volume: 0mL Perfusion (Tmax>6.0s) volume: 3mL (anterior inferior left temporal lobe, likely artifact) Mismatch Volume: 3mL Infarction Location:Not applicable CTA NECK Skeleton: Absent dentition. Cervical disc and endplate degeneration. No acute osseous abnormality identified. Upper chest: Negative. Other neck: Negative. Aortic arch: 3 vessel arch configuration with mild great vessel origin atherosclerosis. Right carotid system: Soft and calcified plaque in the proximal brachiocephalic artery but no significant stenosis. Negative right CCA origin. Mildly tortuous right CCA. Calcified plaque at the right ICA origin without stenosis. Tortuous vessel distal to the bulb with a kinked  appearance (series 9, image 22). Left carotid system: Mild plaque at the left CCA origin without stenosis. Tortuous left CCA. No significant plaque at the left carotid bifurcation or proximal ICA. Tortuous left ICA distal to the bulb similar to the right side with a kinked appearance. Vertebral arteries: Normal proximal right subclavian artery and right vertebral artery origin. The right vertebral artery is patent and within normal limits to the skull base. Mild plaque in the proximal left subclavian artery without stenosis. Normal left vertebral artery origin. Tortuous left V1 segment. The left vertebral is patent and within normal limits to the skull base. CTA HEAD Posterior circulation: The distal right vertebral artery is mildly dominant especially beyond the left PICA origin. No distal vertebral stenosis. Patent vertebrobasilar junction and basilar artery without stenosis. The right AICA appears dominant. Normal SCA and PCA origins. Posterior communicating arteries are diminutive or absent. Bilateral PCA branches are within normal limits. Anterior circulation: Both ICA siphons are patent. Moderate calcified plaque of the left siphon cavernous segment with associated moderate to severe stenosis in the distal cavernous segment (radiographic string sign series 6, image 93 and series 7, image 105 just proximal to the anterior genu. Supraclinoid left ICA remains patent and within normal limits. Mild to moderate right cavernous ICA calcified plaque and mild to moderate stenosis (series 5, image 109). Patent and negative supraclinoid right ICA. Patent carotid termini, MCA and ACA origins. Anterior communicating artery is diminutive or absent. Bilateral ACA branches are within normal limits. Left MCA M1 segment and bifurcation are patent without stenosis. Right MCA M1 segment and bifurcation are patent without stenosis. Bilateral MCA branches are within normal limits. Venous sinuses: Patent. Anatomic variants: Mildly  dominant right vertebral artery. Review of the MIP images confirms the above findings IMPRESSION: 1. Negative for large vessel occlusion. No core infarct detected by CT Perfusion. Stable and negative plain CT appearance of the brain. 2. 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 There is contralateral mild to moderate right ICA siphon plaque and stenosis. 3. But there is very little extracranial atherosclerosis, with no significant vertebral artery or cervical carotid stenosis. Electronically Signed: By: Odessa Fleming M.D. On: 05/28/2020 18:32   CT HEAD CODE STROKE WO CONTRAST  Addendum Date: 05/28/2020   ADDENDUM REPORT: 05/28/2020 18:58 ADDENDUM: Study discussed by telephone with Dr. Fanny Bien on 05/28/2020 at 1837 hours. Electronically Signed   By: Odessa Fleming M.D.   On: 05/28/2020 18:58   Result Date: 05/28/2020 CLINICAL DATA:  Code stroke. 59 year old female with sudden onset left side weakness and heaviness. EXAM: CT HEAD WITHOUT CONTRAST CT ANGIOGRAPHY HEAD AND NECK CT PERFUSION BRAIN TECHNIQUE: Multidetector CT imaging of the head, and CTA head and neck was performed using the standard protocol during bolus administration of intravenous contrast. Multiplanar CT image reconstructions and MIPs were obtained to evaluate the vascular anatomy. Carotid stenosis measurements (when applicable) are obtained utilizing NASCET criteria, using the distal internal carotid diameter as the denominator. Multiphase CT imaging of the brain was performed following IV bolus contrast injection. Subsequent parametric perfusion maps were calculated using RAPID software. CONTRAST:  OMNIPAQUE IOHEXOL 350 MG/ML SOLN COMPARISON:  Head CT 05/26/2019. FINDINGS: CT HEAD FINDINGS Brain: Cerebral volume is stable and within normal limits for age. No midline shift, ventriculomegaly, mass effect, evidence of mass lesion, intracranial hemorrhage or evidence of cortically based  acute infarction. Gray-white matter differentiation is within normal limits throughout the brain. Chronic partially empty  sella. No cortical encephalomalacia identified. Vascular: Calcified atherosclerosis at the skull base. No suspicious intracranial vascular hyperdensity. Skull: No acute osseous abnormality identified. Sinuses/Orbits: Visualized paranasal sinuses and mastoids are stable and well pneumatized. Other: Visualized orbits and scalp soft tissues are within normal limits. ASPECTS [facility]([place] Stroke Program Early CT Score) Total score (0-10 with 10 being normal): 10 Review of the MIP images confirms the above findings CT Brain Perfusion Findings: ASPECTS: 10 CBF (<30%) Volume: 0mL Perfusion (Tmax>6.0s) volume: 3mL (anterior inferior left temporal lobe, likely artifact) Mismatch Volume: 3mL Infarction Location:Not applicable CTA NECK Skeleton: Absent dentition. Cervical disc and endplate degeneration. No acute osseous abnormality identified. Upper chest: Negative. Other neck: Negative. Aortic arch: 3 vessel arch configuration with mild great vessel origin atherosclerosis. Right carotid system: Soft and calcified plaque in the proximal brachiocephalic artery but no significant stenosis. Negative right CCA origin. Mildly tortuous right CCA. Calcified plaque at the right ICA origin without stenosis. Tortuous vessel distal to the bulb with a kinked appearance (series 9, image 22). Left carotid system: Mild plaque at the left CCA origin without stenosis. Tortuous left CCA. No significant plaque at the left carotid bifurcation or proximal ICA. Tortuous left ICA distal to the bulb similar to the right side with a kinked appearance. Vertebral arteries: Normal proximal right subclavian artery and right vertebral artery origin. The right vertebral artery is patent and within normal limits to the skull base. Mild plaque in the proximal left subclavian artery without stenosis. Normal left vertebral artery origin. Tortuous  left V1 segment. The left vertebral is patent and within normal limits to the skull base. CTA HEAD Posterior circulation: The distal right vertebral artery is mildly dominant especially beyond the left PICA origin. No distal vertebral stenosis. Patent vertebrobasilar junction and basilar artery without stenosis. The right AICA appears dominant. Normal SCA and PCA origins. Posterior communicating arteries are diminutive or absent. Bilateral PCA branches are within normal limits. Anterior circulation: Both ICA siphons are patent. Moderate calcified plaque of the left siphon cavernous segment with associated moderate to severe stenosis in the distal cavernous segment (radiographic string sign series 6, image 93 and series 7, image 105 just proximal to the anterior genu. Supraclinoid left ICA remains patent and within normal limits. Mild to moderate right cavernous ICA calcified plaque and mild to moderate stenosis (series 5, image 109). Patent and negative supraclinoid right ICA. Patent carotid termini, MCA and ACA origins. Anterior communicating artery is diminutive or absent. Bilateral ACA branches are within normal limits. Left MCA M1 segment and bifurcation are patent without stenosis. Right MCA M1 segment and bifurcation are patent without stenosis. Bilateral MCA branches are within normal limits. Venous sinuses: Patent. Anatomic variants: Mildly dominant right vertebral artery. Review of the MIP images confirms the above findings IMPRESSION: 1. Negative for large vessel occlusion. No core infarct detected by CT Perfusion. Stable and negative plain CT appearance of the brain. 2. 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 There is contralateral mild to moderate right ICA siphon plaque and stenosis. 3. But there is very little extracranial atherosclerosis, with no significant vertebral artery or cervical carotid stenosis. Electronically Signed: By: Odessa FlemingH   Hall M.D. On: 05/28/2020 18:32   Assessment/Plan The patient is a 59 year old female with a past medical history of hypertension, diabetes type 2, obesity, coronary artery disease status post MI who presented to [facility] emergency department with a chief complaints of left-sided weakness.  1.  Carotid Stenosis: Reviewed images with Dr. Wyn Quaker Mild cervical disease.  No cervical vessel intervention needed at this time.  We will follow in the outpatient setting. Notable intracranial disease.  Continue managing medically with aspirin and statin.  Possible neurovascular consult in outpatient setting.  2.  Diabetes type 2: Encouraged good control as its slows the progression of atherosclerotic disease  3.  Hypertension: Encouraged good control as its slows the progression of atherosclerotic disease  Discussed with Dr. Weldon Inches, PA-C  05/30/2020 11:01 AM  This note was created with Dragon medical transcription system.  Any error is purely unintentional

## 2020-05-30 NOTE — Evaluation (Signed)
Occupational Therapy Evaluation Patient Details Name: Penny Medina MRN: [id number] DOB: 1960-11-10 Today's Date: 05/30/2020    History of Present Illness 59 y.o. female with a hx of morbid obesity, pelvic inflammatory disease, cervical radiculopathy, diabetes, former smoker, aortic atherosclerosis, calcifications in the LAD, presenting with headache, weakness on the left. Imaging negative for acute infarct.   Clinical Impression   Pt was seen for OT evaluation this date. Prior to hospital admission, pt was independent with ADL, and occasionally using a SPC for mobility 2/2 balance deficits. Pt lives with her dtr with her bed/bath on the main floor and level entry. Currently pt demonstrates impairments in balance, activity tolerance, LLE and bilat hand sensation, and strength as described below (See OT problem list) which functionally limit her ability to perform ADL/self-care tasks safely at baseline independence. Pt currently requires PRN Min A for LB ADL tasks and CGA for ADL transfers and mobility with RW. Pt instructed in RW mgt during ADL mobility, falls prevention, and AE/DME for ADL.  Pt would benefit from skilled OT services to address noted impairments and functional limitations (see below for any additional details) in order to maximize safety and independence while minimizing falls risk and caregiver burden. Upon hospital discharge, recommend OP OT to maximize pt safety and return to PLOF.     Follow Up Recommendations  Outpatient OT    Equipment Recommendations  Other (comment) (2WW)    Recommendations for Other Services       Precautions / Restrictions Precautions Precautions: Fall Restrictions Weight Bearing Restrictions: No      Mobility Bed Mobility Overal bed mobility: Needs Assistance Bed Mobility: Supine to Sit;Sit to Supine     Supine to sit: Supervision Sit to supine: Supervision   General bed mobility comments: effortful and additional time to perform,  use of bed rails  Transfers Overall transfer level: Needs assistance Equipment used: Rolling walker (2 wheeled) Transfers: Sit to/from Stand Sit to Stand: Min guard         General transfer comment: effortful and additional time to perform    Balance Overall balance assessment: Needs assistance Sitting-balance support: Feet supported;No upper extremity supported Sitting balance-Leahy Scale: Fair     Standing balance support: Bilateral upper extremity supported Standing balance-Leahy Scale: Poor Standing balance comment: slight LOB but pt able to self correct, unsteady                           ADL either performed or assessed with clinical judgement   ADL Overall ADL's : Needs assistance/impaired                                       General ADL Comments: CGA for ADL transfers, PRN Min A for LB ADL     Vision Patient Visual Report: No change from baseline       Perception     Praxis      Pertinent Vitals/Pain Pain Assessment: 0-10 Pain Score: 9  Pain Location: bilat hands (chronic, but typically less painful when taking more medication - 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 dose) Pain Descriptors / Indicators: Aching Pain Intervention(s): Limited activity within patient's tolerance;Monitored during session;Repositioned     Hand Dominance Left   Extremity/Trunk Assessment Upper Extremity Assessment Upper Extremity Assessment: Generalized weakness;RUE deficits/detail;LUE deficits/detail RUE Deficits / Details: shoulder flex 4-/5, elbow flex/ext 4/5,  grip/pinch 4+/5, endorses decreased sensation to 3rd, 4th, and 5th digits RUE Sensation: decreased light touch LUE Deficits / Details: shoulder flex 4-/5, elbow flex/ext 4/5, grip/pinch 4+/5, endorses decreased sensation to initially 1st and 2nd digits but now worsening to all 5 digits   Lower Extremity Assessment Lower Extremity Assessment: Generalized weakness;RLE  deficits/detail;LLE deficits/detail RLE Deficits / Details: grossly 4/5, denies sensation deficits LLE Deficits / Details: grossly 4-/5, endorses decreased sensation to LLE LLE Sensation: decreased light touch   Cervical / Trunk Assessment Cervical / Trunk Assessment: Normal   Communication Communication Communication: No difficulties   Cognition Arousal/Alertness: Awake/alert Behavior During Therapy: WFL for tasks assessed/performed Overall Cognitive Status: Within Functional Limits for tasks assessed                                     General Comments  Pt endorsed slight lightheadedness/whoozy with transition from sitting to standing which improved over time, reports this as chronic and intermittent; sitting BP 145/96, standing BP 143/89    Exercises Other Exercises Other Exercises: Pt instructed in RW mgt for ADL mobility, falls prevention strategies, AE/DME   Shoulder Instructions      Home Living Family/patient expects to be discharged to:: Private residence Living Arrangements: Children (adult dtr lives with her) Available Help at Discharge: Family;Available 24 hours/day (dtr works from home) Type of Home: House Home Access: Level entry     Home Layout: Two level;Able to live on main level with bedroom/bathroom     Bathroom Shower/Tub: Chief Strategy Officer: Standard     Home Equipment: Cane - single point;Grab bars - toilet          Prior Functioning/Environment Level of Independence: Independent with assistive device(s)        Comments: PRN SPC use for mobility "when I'm having balance issues", indep with ADL and IADL Tasks, no falls in 1mo        OT Problem List: Decreased strength;Pain;Impaired sensation;Decreased activity tolerance;Impaired balance (sitting and/or standing);Decreased knowledge of use of DME or AE;Obesity      OT Treatment/Interventions: Self-care/ADL training;Therapeutic exercise;Therapeutic  activities;Energy conservation;DME and/or AE instruction;Patient/family education;Balance training    OT Goals(Current goals can be found in the care plan section) Acute Rehab OT Goals Patient Stated Goal: go home and feel better OT Goal Formulation: With patient Time For Goal Achievement: 06/13/20 Potential to Achieve Goals: Good ADL Goals Pt Will Perform Lower Body Dressing: with modified independence;with adaptive equipment;sit to/from stand Pt Will Transfer to Toilet: with modified independence;ambulating;regular height toilet;grab bars (LRAD) Additional ADL Goal #1: Pt will verbalize plan to implement at least 2 learned energy conservation strategies to maximize safety/indep with ADL and IADL tasks.  OT Frequency: Min 1X/week   Barriers to D/C:            Co-evaluation              AM-PAC OT "6 Clicks" Daily Activity     Outcome Measure Help from another person eating meals?: None Help from another person taking care of personal grooming?: None Help from another person toileting, which includes using toliet, bedpan, or urinal?: A Little Help from another person bathing (including washing, rinsing, drying)?: A Little Help from another person to put on and taking off regular upper body clothing?: None Help from another person to put on and taking off regular lower body clothing?: A Little 6 Click Score: 21  End of Session Equipment Utilized During Treatment: Gait belt;Rolling walker  Activity Tolerance: Patient tolerated treatment well Patient left: in bed;with call bell/phone within reach;with bed alarm set;with SCD's reapplied  OT Visit Diagnosis: Other abnormalities of gait and mobility (R26.89);Muscle weakness (generalized) (M62.81)                Time: 8469-6295 OT Time Calculation (min): 35 min Charges:  OT General Charges $OT Visit: 1 Visit OT Evaluation $OT Eval Moderate Complexity: 1 Mod OT Treatments $Self Care/Home Management : 8-22 mins $Therapeutic  Activity: 8-22 mins  Richrd Prime, MPH, MS, OTR/L ascom [phone] 05/30/20, 10:43 AM

## 2020-05-30 NOTE — Progress Notes (Signed)
Pt wheeled to car by staff, son providing transportation

## 2020-05-30 NOTE — Discharge Summary (Addendum)
Penny Medina TFT:[id number] DOB: February 25, 1961 DOA: 05/28/2020  PCP: Patient, No Pcp Per  Admit date: 05/28/2020 Discharge date: 05/30/2020  Admitted From: home Disposition:  home  Recommendations for Outpatient Follow-up:  1. Follow up with PCP in 1 week 2. Please obtain BMP/CBC in one week 3. F/u with neurology , Dr. Sherryll Burger.  4. F/u with Dr. Mariah Milling in one week  Home Health:HH    Discharge Condition:Stable CODE STATUS:full  Diet recommendation: Heart Healthy / Carb Modified  Brief/Interim Summary: Penny Thomasis a 59 y.o.femalewith medical history significant of migraines, DM2, HTN Cervical radiculopathy, CADPresented withSevere headache since 3 AM Developed some speech problems and heavy ness on the left In afternoon developed left side weakness. She initially took Neurontin and Robaxin to help with her headache but with no improvement.   Stroke-like symptoms--ruled out for acute infarct  CTArevealed severe stenosis of left ICA  MRI-evidence of recent infarction, hemorrhage, or mass. Probable minor chronic microvascular ischemic changes Vascular consulted.  Patient with positive for severe stenosis of intracranial left ICA. On aspirin and statin Will need to follow-up with vascular as outpatient with possible outpatient follow-up with neurovascular after there PT OT recommend home health Neurology following-recommend aspirin, increasing Neurontin 400 mg 3 times daily for pain, follow-up with neurology as outpatient MRI cervical/lumbar- unchanged, x4-5. Rt c5-6 and c6-7 with moderate foraminal stenosis. Left asymmetric disc bulge at L5-S1 with narrowing of the lateral recesses and severe left neural foraminal stenosis   WCT-starting for VT, unable to exclude other wide-complex rhythm such as apparent tachycardia Cardiology consulted Patient appeared to be asymptomatic Has risk factors for cardiac disease Echo pending We will continue on monitor telemetry  today Restarted her carvedilol at a lower dose increase as tolerated via blood pressure and heart rate Per cards may need outpt  monitor at discharge as outpatient if no further telemetry evidence of wide-complex tach he Spoke to DR. Gollan via chat, pt will have CTA as outpt with cardiology office  .Essential hypertension Continue outpt meds  .Obesity, Class III, BMI 40-49.9 (morbid obesity) (HCC)- Complicates issues.   DM2 -- Uncontrolled , A1c 11.8 Pt will need to f/u with pcp for further management, may need to start on insulin Pt was counseled on better bg control.    Discharge Diagnoses:  Active Problems:   Stroke-like symptoms   Essential hypertension   Type 2 diabetes mellitus without complication (HCC)   Obesity, Class III, BMI 40-49.9 (morbid obesity) (HCC)    Discharge Instructions  Discharge Instructions    Call MD for:   Complete by: As directed    Diet - low sodium heart healthy   Complete by: As directed    Diet Carb Modified   Complete by: As directed    Discharge instructions   Complete by: As directed    Follow up with pcp next week for your diabetes as it is very uncontrolled.   Increase activity slowly   Complete by: As directed      Allergies as of 05/30/2020      Reactions   Penicillins    Patient was told by MD in 1986 not to take PCN any longer (due to being allergic) Unknown reaction per patient   Sulfa Antibiotics Rash      Medication List    TAKE these medications   amLODipine 10 MG tablet Commonly known as: NORVASC Take 10 mg by mouth daily.   aspirin 81 MG chewable tablet Chew 81 mg by mouth daily.   atorvastatin  40 MG tablet Commonly known as: LIPITOR Take 1 tablet (40 mg total) by mouth daily.   carvedilol 12.5 MG tablet Commonly known as: COREG Take 12.5 mg by mouth 2 (two) times daily with a meal.   gabapentin 400 MG capsule Commonly known as: NEURONTIN Take 1 capsule (400 mg total) by mouth 3 (three)  times daily. What changed:   medication strength  how much to take  Another medication with the same name was removed. Continue taking this medication, and follow the directions you see here.   glimepiride 2 MG tablet Commonly known as: AMARYL Take 2 mg by mouth daily with breakfast.   glipiZIDE 5 MG tablet Commonly known as: GLUCOTROL Take 5 mg by mouth 2 (two) times daily before a meal.   living well with diabetes book Misc 1 each by Does not apply route once for 1 dose.   metFORMIN 500 MG tablet Commonly known as: GLUCOPHAGE Take 500 mg by mouth 2 (two) times daily with a meal.   methocarbamol 500 MG tablet Commonly known as: ROBAXIN Take 500 mg by mouth every 8 (eight) hours as needed for muscle spasms.            Durable Medical Equipment  (From admission, onward)         Start     Ordered   05/30/20 1658  For home use only DME Walker rolling  Once       Question Answer Comment  Walker: With 5 Inch Wheels   Patient needs a walker to treat with the following condition Weakness      05/30/20 1657          Follow-up Information    Dew, Marlow Baars, MD Follow up in 6 month(s).   Specialties: Vascular Surgery, Radiology, Interventional Cardiology Why: Can see Dew or Vivia Birmingham.  Will need bilateral carotid duplex with visit.  Seen as consult. Contact information: [address] [place] [address] [phone]        Antonieta Iba, MD Follow up in 1 week(s).   Specialty: Cardiology Contact information: [address] [place] [address] [phone]        Lonell Face, MD Follow up in 2 week(s).   Specialty: Neurology Contact information: [phone] [facility] [address] [facility] [place] [address] [phone]              Allergies  Allergen Reactions  . Penicillins     Patient was told by MD in 1986 not to take PCN any longer (due to being allergic) Unknown reaction per patient  . Sulfa  Antibiotics Rash    Consultations:  Cardiology, vascular , neurology   Procedures/Studies: CT Angio Head W or Wo Contrast  Addendum Date: 05/28/2020   ADDENDUM REPORT: 05/28/2020 18:58 ADDENDUM: Study discussed by telephone with Dr. Fanny Bien on 05/28/2020 at 1837 hours. Electronically Signed   By: Odessa Fleming M.D.   On: 05/28/2020 18:58   Result Date: 05/28/2020 CLINICAL DATA:  Code stroke. 59 year old female with sudden onset left side weakness and heaviness. EXAM: CT HEAD WITHOUT CONTRAST CT ANGIOGRAPHY HEAD AND NECK CT PERFUSION BRAIN TECHNIQUE: Multidetector CT imaging of the head, and CTA head and neck was performed using the standard protocol during bolus administration of intravenous contrast. Multiplanar CT image reconstructions and MIPs were obtained to evaluate the vascular anatomy. Carotid stenosis measurements (when applicable) are obtained utilizing NASCET criteria, using the distal internal carotid diameter as the denominator. Multiphase CT imaging  of the brain was performed following IV bolus contrast injection. Subsequent parametric perfusion maps were calculated using RAPID software. CONTRAST:  OMNIPAQUE IOHEXOL 350 MG/ML SOLN COMPARISON:  Head CT 05/26/2019. FINDINGS: CT HEAD FINDINGS Brain: Cerebral volume is stable and within normal limits for age. No midline shift, ventriculomegaly, mass effect, evidence of mass lesion, intracranial hemorrhage or evidence of cortically based acute infarction. Gray-white matter differentiation is within normal limits throughout the brain. Chronic partially empty sella. No cortical encephalomalacia identified. Vascular: Calcified atherosclerosis at the skull base. No suspicious intracranial vascular hyperdensity. Skull: No acute osseous abnormality identified. Sinuses/Orbits: Visualized paranasal sinuses and mastoids are stable and well pneumatized. Other: Visualized orbits and scalp soft tissues are within normal limits. ASPECTS [facility] Stroke  Program Early CT Score) Total score (0-10 with 10 being normal): 10 Review of the MIP images confirms the above findings CT Brain Perfusion Findings: ASPECTS: 10 CBF (<30%) Volume: 0mL Perfusion (Tmax>6.0s) volume: 3mL (anterior inferior left temporal lobe, likely artifact) Mismatch Volume: 3mL Infarction Location:Not applicable CTA NECK Skeleton: Absent dentition. Cervical disc and endplate degeneration. No acute osseous abnormality identified. Upper chest: Negative. Other neck: Negative. Aortic arch: 3 vessel arch configuration with mild great vessel origin atherosclerosis. Right carotid system: Soft and calcified plaque in the proximal brachiocephalic artery but no significant stenosis. Negative right CCA origin. Mildly tortuous right CCA. Calcified plaque at the right ICA origin without stenosis. Tortuous vessel distal to the bulb with a kinked appearance (series 9, image 22). Left carotid system: Mild plaque at the left CCA origin without stenosis. Tortuous left CCA. No significant plaque at the left carotid bifurcation or proximal ICA. Tortuous left ICA distal to the bulb similar to the right side with a kinked appearance. Vertebral arteries: Normal proximal right subclavian artery and right vertebral artery origin. The right vertebral artery is patent and within normal limits to the skull base. Mild plaque in the proximal left subclavian artery without stenosis. Normal left vertebral artery origin. Tortuous left V1 segment. The left vertebral is patent and within normal limits to the skull base. CTA HEAD Posterior circulation: The distal right vertebral artery is mildly dominant especially beyond the left PICA origin. No distal vertebral stenosis. Patent vertebrobasilar junction and basilar artery without stenosis. The right AICA appears dominant. Normal SCA and PCA origins. Posterior communicating arteries are diminutive or absent. Bilateral PCA branches are within normal limits. Anterior circulation: Both  ICA siphons are patent. Moderate calcified plaque of the left siphon cavernous segment with associated moderate to severe stenosis in the distal cavernous segment (radiographic string sign series 6, image 93 and series 7, image 105 just proximal to the anterior genu. Supraclinoid left ICA remains patent and within normal limits. Mild to moderate right cavernous ICA calcified plaque and mild to moderate stenosis (series 5, image 109). Patent and negative supraclinoid right ICA. Patent carotid termini, MCA and ACA origins. Anterior communicating artery is diminutive or absent. Bilateral ACA branches are within normal limits. Left MCA M1 segment and bifurcation are patent without stenosis. Right MCA M1 segment and bifurcation are patent without stenosis. Bilateral MCA branches are within normal limits. Venous sinuses: Patent. Anatomic variants: Mildly dominant right vertebral artery. Review of the MIP images confirms the above findings IMPRESSION: 1. Negative for large vessel occlusion. No core infarct detected by CT Perfusion. Stable and negative plain CT appearance of the brain. 2. 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 There is contralateral mild to  moderate right ICA siphon plaque and stenosis. 3. But there is very little extracranial atherosclerosis, with no significant vertebral artery or cervical carotid stenosis. Electronically Signed: By: Odessa Fleming M.D. On: 05/28/2020 18:32   DG Chest 2 View  Result Date: 05/28/2020 CLINICAL DATA:  Left-sided weakness and headache.  Hypertension EXAM: CHEST - 2 VIEW COMPARISON:  CT angiogram chest July 26, 2019 FINDINGS: Lungs are clear. There is cardiomegaly with pulmonary vascularity normal. No adenopathy. No bone lesions. IMPRESSION: Cardiomegaly.  Lungs clear.  No adenopathy evident. Electronically Signed   By: Bretta Bang III M.D.   On: 05/28/2020 19:27   CT Angio Neck W and/or Wo  Contrast  Addendum Date: 05/28/2020   ADDENDUM REPORT: 05/28/2020 18:58 ADDENDUM: Study discussed by telephone with Dr. Fanny Bien on 05/28/2020 at 1837 hours. Electronically Signed   By: Odessa Fleming M.D.   On: 05/28/2020 18:58   Result Date: 05/28/2020 CLINICAL DATA:  Code stroke. 60 year old female with sudden onset left side weakness and heaviness. EXAM: CT HEAD WITHOUT CONTRAST CT ANGIOGRAPHY HEAD AND NECK CT PERFUSION BRAIN TECHNIQUE: Multidetector CT imaging of the head, and CTA head and neck was performed using the standard protocol during bolus administration of intravenous contrast. Multiplanar CT image reconstructions and MIPs were obtained to evaluate the vascular anatomy. Carotid stenosis measurements (when applicable) are obtained utilizing NASCET criteria, using the distal internal carotid diameter as the denominator. Multiphase CT imaging of the brain was performed following IV bolus contrast injection. Subsequent parametric perfusion maps were calculated using RAPID software. CONTRAST:  OMNIPAQUE IOHEXOL 350 MG/ML SOLN COMPARISON:  Head CT 05/26/2019. FINDINGS: CT HEAD FINDINGS Brain: Cerebral volume is stable and within normal limits for age. No midline shift, ventriculomegaly, mass effect, evidence of mass lesion, intracranial hemorrhage or evidence of cortically based acute infarction. Gray-white matter differentiation is within normal limits throughout the brain. Chronic partially empty sella. No cortical encephalomalacia identified. Vascular: Calcified atherosclerosis at the skull base. No suspicious intracranial vascular hyperdensity. Skull: No acute osseous abnormality identified. Sinuses/Orbits: Visualized paranasal sinuses and mastoids are stable and well pneumatized. Other: Visualized orbits and scalp soft tissues are within normal limits. ASPECTS [facility] Stroke Program Early CT Score) Total score (0-10 with 10 being normal): 10 Review of the MIP images confirms the above findings CT  Brain Perfusion Findings: ASPECTS: 10 CBF (<30%) Volume: 0mL Perfusion (Tmax>6.0s) volume: 3mL (anterior inferior left temporal lobe, likely artifact) Mismatch Volume: 3mL Infarction Location:Not applicable CTA NECK Skeleton: Absent dentition. Cervical disc and endplate degeneration. No acute osseous abnormality identified. Upper chest: Negative. Other neck: Negative. Aortic arch: 3 vessel arch configuration with mild great vessel origin atherosclerosis. Right carotid system: Soft and calcified plaque in the proximal brachiocephalic artery but no significant stenosis. Negative right CCA origin. Mildly tortuous right CCA. Calcified plaque at the right ICA origin without stenosis. Tortuous vessel distal to the bulb with a kinked appearance (series 9, image 22). Left carotid system: Mild plaque at the left CCA origin without stenosis. Tortuous left CCA. No significant plaque at the left carotid bifurcation or proximal ICA. Tortuous left ICA distal to the bulb similar to the right side with a kinked appearance. Vertebral arteries: Normal proximal right subclavian artery and right vertebral artery origin. The right vertebral artery is patent and within normal limits to the skull base. Mild plaque in the proximal left subclavian artery without stenosis. Normal left vertebral artery origin. Tortuous left V1 segment. The left vertebral is patent and within normal limits to the skull  base. CTA HEAD Posterior circulation: The distal right vertebral artery is mildly dominant especially beyond the left PICA origin. No distal vertebral stenosis. Patent vertebrobasilar junction and basilar artery without stenosis. The right AICA appears dominant. Normal SCA and PCA origins. Posterior communicating arteries are diminutive or absent. Bilateral PCA branches are within normal limits. Anterior circulation: Both ICA siphons are patent. Moderate calcified plaque of the left siphon cavernous segment with associated moderate to severe  stenosis in the distal cavernous segment (radiographic string sign series 6, image 93 and series 7, image 105 just proximal to the anterior genu. Supraclinoid left ICA remains patent and within normal limits. Mild to moderate right cavernous ICA calcified plaque and mild to moderate stenosis (series 5, image 109). Patent and negative supraclinoid right ICA. Patent carotid termini, MCA and ACA origins. Anterior communicating artery is diminutive or absent. Bilateral ACA branches are within normal limits. Left MCA M1 segment and bifurcation are patent without stenosis. Right MCA M1 segment and bifurcation are patent without stenosis. Bilateral MCA branches are within normal limits. Venous sinuses: Patent. Anatomic variants: Mildly dominant right vertebral artery. Review of the MIP images confirms the above findings IMPRESSION: 1. Negative for large vessel occlusion. No core infarct detected by CT Perfusion. Stable and negative plain CT appearance of the brain. 2. 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 There is contralateral mild to moderate right ICA siphon plaque and stenosis. 3. But there is very little extracranial atherosclerosis, with no significant vertebral artery or cervical carotid stenosis. Electronically Signed: By: Odessa FlemingH  Hall M.D. On: 05/28/2020 18:32   MR BRAIN WO CONTRAST  Result Date: 05/29/2020 CLINICAL DATA:  Neuro deficit, acute, stroke suspected EXAM: MRI HEAD WITHOUT CONTRAST TECHNIQUE: Multiplanar, multiecho pulse sequences of the brain and surrounding structures were obtained without intravenous contrast. COMPARISON:  Brain MRI 05/29/2020 at 1:40 p.m. FINDINGS: Brain: No acute infarct, acute hemorrhage or extra-axial collection. A few scattered foci of T2WI-hyperintensity in the white matter, nonspecific. Normal volume of CSF spaces. No chronic microhemorrhage. Normal midline structures. Vascular: Normal flow voids. Skull and  upper cervical spine: Normal marrow signal. Sinuses/Orbits: Negative. Other: None. IMPRESSION: Normal MRI of the brain. Electronically Signed   By: Deatra RobinsonKevin  Herman M.D.   On: 05/29/2020 22:56   MR BRAIN WO CONTRAST  Result Date: 05/29/2020 CLINICAL DATA:  Abnormal speech, left-sided weakness EXAM: MRI HEAD WITHOUT CONTRAST TECHNIQUE: Multiplanar, multiecho pulse sequences of the brain and surrounding structures were obtained without intravenous contrast. COMPARISON:  None. FINDINGS: Brain: There is no acute infarction or intracranial hemorrhage. There is no intracranial mass, mass effect, or edema. There is no hydrocephalus or extra-axial fluid collection. There are minimal small foci of T2 hyperintensity in the supratentorial white matter likely reflecting nonspecific gliosis/demyelination potentially due to chronic microvascular ischemic changes in a patient of this age. Ventricles and sulci are normal in size and configuration. Vascular: Major vessel flow voids at the skull base are preserved. Skull and upper cervical spine: Normal marrow signal is preserved. Sinuses/Orbits: Paranasal sinuses are aerated. Orbits are unremarkable. Other: Sella is partially empty.  Mastoid air cells are clear. IMPRESSION: No evidence of recent infarction, hemorrhage, or mass. Probable minor chronic microvascular ischemic changes. Electronically Signed   By: Guadlupe SpanishPraneil  Patel M.D.   On: 05/29/2020 14:19   MR CERVICAL SPINE WO CONTRAST  Result Date: 05/29/2020 CLINICAL DATA:  Low back pain and progressive neurologic deficits. EXAM: MRI CERVICAL, THORACIC AND LUMBAR SPINE WITHOUT  CONTRAST TECHNIQUE: Multiplanar and multiecho pulse sequences of the cervical spine, to include the craniocervical junction and cervicothoracic junction, and thoracic and lumbar spine, were obtained without intravenous contrast. COMPARISON:  Cervical spine MRI 09/05/2019 FINDINGS: MRI CERVICAL SPINE FINDINGS Alignment: Grade 1 retrolisthesis at C5-6.  Straightening of normal cervical lordosis. Vertebrae: No acute abnormality. Cord: Hyperintense T2-weighted signal within the spinal cord at the C4-5 level, unchanged. Posterior Fossa, vertebral arteries, paraspinal tissues: Negative. Disc levels: C1-2: Unremarkable. C2-3: Normal disc space and facet joints. There is no spinal canal stenosis. No neural foraminal stenosis. C3-4: Normal disc space and facet joints. There is no spinal canal stenosis. No neural foraminal stenosis. C4-5: Intermediate sized disc bulge. Moderate spinal canal stenosis. Mild right neural foraminal stenosis. C5-6: Right-greater-than-left uncovertebral hypertrophy and small disc bulge. There is no spinal canal stenosis. Moderate right and mild left neural foraminal stenosis. C6-7: Right uncovertebral hypertrophy. There is no spinal canal stenosis. Moderate right neural foraminal stenosis. C7-T1: Normal disc space and facet joints. There is no spinal canal stenosis. No neural foraminal stenosis. MRI THORACIC SPINE FINDINGS Alignment:  Physiologic. Vertebrae: No fracture, evidence of discitis, or bone lesion. Cord:  Normal signal and morphology. Paraspinal and other soft tissues: Negative. Disc levels: Mild degenerative disc disease without spinal canal or neural foraminal stenosis. MRI LUMBAR SPINE FINDINGS Segmentation:  Standard. Alignment:  Physiologic. Vertebrae:  No fracture, evidence of discitis, or bone lesion. Conus medullaris and cauda equina: Conus extends to the L1 level. Conus and cauda equina appear normal. Paraspinal and other soft tissues: Negative. Disc levels: Disc levels above L5 are normal. L5-S1: Disc space narrowing with endplate spurring and intermediate left asymmetric bulge. There is narrowing of the lateral recesses and severe left foraminal stenosis. Mild right foraminal stenosis. IMPRESSION: 1. No acute abnormality of the cervical, thoracic or lumbar spine. 2. Unchanged hyperintense T2-weighted signal within the spinal  cord at the C4-5 level is consistent with myelomalacia. 3. Moderate right C5-6 and C6-7 neural foraminal stenosis. 4. Left asymmetric disc bulge at L5-S1 with narrowing of the lateral recesses and severe left neural foraminal stenosis. Electronically Signed   By: Deatra Robinson M.D.   On: 05/29/2020 22:42   MR THORACIC SPINE WO CONTRAST  Result Date: 05/29/2020 CLINICAL DATA:  Low back pain and progressive neurologic deficits. EXAM: MRI CERVICAL, THORACIC AND LUMBAR SPINE WITHOUT CONTRAST TECHNIQUE: Multiplanar and multiecho pulse sequences of the cervical spine, to include the craniocervical junction and cervicothoracic junction, and thoracic and lumbar spine, were obtained without intravenous contrast. COMPARISON:  Cervical spine MRI 09/05/2019 FINDINGS: MRI CERVICAL SPINE FINDINGS Alignment: Grade 1 retrolisthesis at C5-6. Straightening of normal cervical lordosis. Vertebrae: No acute abnormality. Cord: Hyperintense T2-weighted signal within the spinal cord at the C4-5 level, unchanged. Posterior Fossa, vertebral arteries, paraspinal tissues: Negative. Disc levels: C1-2: Unremarkable. C2-3: Normal disc space and facet joints. There is no spinal canal stenosis. No neural foraminal stenosis. C3-4: Normal disc space and facet joints. There is no spinal canal stenosis. No neural foraminal stenosis. C4-5: Intermediate sized disc bulge. Moderate spinal canal stenosis. Mild right neural foraminal stenosis. C5-6: Right-greater-than-left uncovertebral hypertrophy and small disc bulge. There is no spinal canal stenosis. Moderate right and mild left neural foraminal stenosis. C6-7: Right uncovertebral hypertrophy. There is no spinal canal stenosis. Moderate right neural foraminal stenosis. C7-T1: Normal disc space and facet joints. There is no spinal canal stenosis. No neural foraminal stenosis. MRI THORACIC SPINE FINDINGS Alignment:  Physiologic. Vertebrae: No fracture, evidence of discitis, or bone lesion.  Cord:   Normal signal and morphology. Paraspinal and other soft tissues: Negative. Disc levels: Mild degenerative disc disease without spinal canal or neural foraminal stenosis. MRI LUMBAR SPINE FINDINGS Segmentation:  Standard. Alignment:  Physiologic. Vertebrae:  No fracture, evidence of discitis, or bone lesion. Conus medullaris and cauda equina: Conus extends to the L1 level. Conus and cauda equina appear normal. Paraspinal and other soft tissues: Negative. Disc levels: Disc levels above L5 are normal. L5-S1: Disc space narrowing with endplate spurring and intermediate left asymmetric bulge. There is narrowing of the lateral recesses and severe left foraminal stenosis. Mild right foraminal stenosis. IMPRESSION: 1. No acute abnormality of the cervical, thoracic or lumbar spine. 2. Unchanged hyperintense T2-weighted signal within the spinal cord at the C4-5 level is consistent with myelomalacia. 3. Moderate right C5-6 and C6-7 neural foraminal stenosis. 4. Left asymmetric disc bulge at L5-S1 with narrowing of the lateral recesses and severe left neural foraminal stenosis. Electronically Signed   By: Deatra Robinson M.D.   On: 05/29/2020 22:42   MR LUMBAR SPINE WO CONTRAST  Result Date: 05/29/2020 CLINICAL DATA:  Low back pain and progressive neurologic deficits. EXAM: MRI CERVICAL, THORACIC AND LUMBAR SPINE WITHOUT CONTRAST TECHNIQUE: Multiplanar and multiecho pulse sequences of the cervical spine, to include the craniocervical junction and cervicothoracic junction, and thoracic and lumbar spine, were obtained without intravenous contrast. COMPARISON:  Cervical spine MRI 09/05/2019 FINDINGS: MRI CERVICAL SPINE FINDINGS Alignment: Grade 1 retrolisthesis at C5-6. Straightening of normal cervical lordosis. Vertebrae: No acute abnormality. Cord: Hyperintense T2-weighted signal within the spinal cord at the C4-5 level, unchanged. Posterior Fossa, vertebral arteries, paraspinal tissues: Negative. Disc levels: C1-2:  Unremarkable. C2-3: Normal disc space and facet joints. There is no spinal canal stenosis. No neural foraminal stenosis. C3-4: Normal disc space and facet joints. There is no spinal canal stenosis. No neural foraminal stenosis. C4-5: Intermediate sized disc bulge. Moderate spinal canal stenosis. Mild right neural foraminal stenosis. C5-6: Right-greater-than-left uncovertebral hypertrophy and small disc bulge. There is no spinal canal stenosis. Moderate right and mild left neural foraminal stenosis. C6-7: Right uncovertebral hypertrophy. There is no spinal canal stenosis. Moderate right neural foraminal stenosis. C7-T1: Normal disc space and facet joints. There is no spinal canal stenosis. No neural foraminal stenosis. MRI THORACIC SPINE FINDINGS Alignment:  Physiologic. Vertebrae: No fracture, evidence of discitis, or bone lesion. Cord:  Normal signal and morphology. Paraspinal and other soft tissues: Negative. Disc levels: Mild degenerative disc disease without spinal canal or neural foraminal stenosis. MRI LUMBAR SPINE FINDINGS Segmentation:  Standard. Alignment:  Physiologic. Vertebrae:  No fracture, evidence of discitis, or bone lesion. Conus medullaris and cauda equina: Conus extends to the L1 level. Conus and cauda equina appear normal. Paraspinal and other soft tissues: Negative. Disc levels: Disc levels above L5 are normal. L5-S1: Disc space narrowing with endplate spurring and intermediate left asymmetric bulge. There is narrowing of the lateral recesses and severe left foraminal stenosis. Mild right foraminal stenosis. IMPRESSION: 1. No acute abnormality of the cervical, thoracic or lumbar spine. 2. Unchanged hyperintense T2-weighted signal within the spinal cord at the C4-5 level is consistent with myelomalacia. 3. Moderate right C5-6 and C6-7 neural foraminal stenosis. 4. Left asymmetric disc bulge at L5-S1 with narrowing of the lateral recesses and severe left neural foraminal stenosis. Electronically  Signed   By: Deatra Robinson M.D.   On: 05/29/2020 22:42   CT CEREBRAL PERFUSION W CONTRAST  Addendum Date: 05/28/2020   ADDENDUM REPORT: 05/28/2020 18:58 ADDENDUM: Study discussed by  telephone with Dr. Fanny Bien on 05/28/2020 at 1837 hours. Electronically Signed   By: Odessa Fleming M.D.   On: 05/28/2020 18:58   Result Date: 05/28/2020 CLINICAL DATA:  Code stroke. 59 year old female with sudden onset left side weakness and heaviness. EXAM: CT HEAD WITHOUT CONTRAST CT ANGIOGRAPHY HEAD AND NECK CT PERFUSION BRAIN TECHNIQUE: Multidetector CT imaging of the head, and CTA head and neck was performed using the standard protocol during bolus administration of intravenous contrast. Multiplanar CT image reconstructions and MIPs were obtained to evaluate the vascular anatomy. Carotid stenosis measurements (when applicable) are obtained utilizing NASCET criteria, using the distal internal carotid diameter as the denominator. Multiphase CT imaging of the brain was performed following IV bolus contrast injection. Subsequent parametric perfusion maps were calculated using RAPID software. CONTRAST:  OMNIPAQUE IOHEXOL 350 MG/ML SOLN COMPARISON:  Head CT 05/26/2019. FINDINGS: CT HEAD FINDINGS Brain: Cerebral volume is stable and within normal limits for age. No midline shift, ventriculomegaly, mass effect, evidence of mass lesion, intracranial hemorrhage or evidence of cortically based acute infarction. Gray-white matter differentiation is within normal limits throughout the brain. Chronic partially empty sella. No cortical encephalomalacia identified. Vascular: Calcified atherosclerosis at the skull base. No suspicious intracranial vascular hyperdensity. Skull: No acute osseous abnormality identified. Sinuses/Orbits: Visualized paranasal sinuses and mastoids are stable and well pneumatized. Other: Visualized orbits and scalp soft tissues are within normal limits. ASPECTS [facility] Stroke Program Early CT Score) Total score (0-10  with 10 being normal): 10 Review of the MIP images confirms the above findings CT Brain Perfusion Findings: ASPECTS: 10 CBF (<30%) Volume: 0mL Perfusion (Tmax>6.0s) volume: 3mL (anterior inferior left temporal lobe, likely artifact) Mismatch Volume: 3mL Infarction Location:Not applicable CTA NECK Skeleton: Absent dentition. Cervical disc and endplate degeneration. No acute osseous abnormality identified. Upper chest: Negative. Other neck: Negative. Aortic arch: 3 vessel arch configuration with mild great vessel origin atherosclerosis. Right carotid system: Soft and calcified plaque in the proximal brachiocephalic artery but no significant stenosis. Negative right CCA origin. Mildly tortuous right CCA. Calcified plaque at the right ICA origin without stenosis. Tortuous vessel distal to the bulb with a kinked appearance (series 9, image 22). Left carotid system: Mild plaque at the left CCA origin without stenosis. Tortuous left CCA. No significant plaque at the left carotid bifurcation or proximal ICA. Tortuous left ICA distal to the bulb similar to the right side with a kinked appearance. Vertebral arteries: Normal proximal right subclavian artery and right vertebral artery origin. The right vertebral artery is patent and within normal limits to the skull base. Mild plaque in the proximal left subclavian artery without stenosis. Normal left vertebral artery origin. Tortuous left V1 segment. The left vertebral is patent and within normal limits to the skull base. CTA HEAD Posterior circulation: The distal right vertebral artery is mildly dominant especially beyond the left PICA origin. No distal vertebral stenosis. Patent vertebrobasilar junction and basilar artery without stenosis. The right AICA appears dominant. Normal SCA and PCA origins. Posterior communicating arteries are diminutive or absent. Bilateral PCA branches are within normal limits. Anterior circulation: Both ICA siphons are patent. Moderate calcified  plaque of the left siphon cavernous segment with associated moderate to severe stenosis in the distal cavernous segment (radiographic string sign series 6, image 93 and series 7, image 105 just proximal to the anterior genu. Supraclinoid left ICA remains patent and within normal limits. Mild to moderate right cavernous ICA calcified plaque and mild to moderate stenosis (series 5, image 109). Patent and negative  supraclinoid right ICA. Patent carotid termini, MCA and ACA origins. Anterior communicating artery is diminutive or absent. Bilateral ACA branches are within normal limits. Left MCA M1 segment and bifurcation are patent without stenosis. Right MCA M1 segment and bifurcation are patent without stenosis. Bilateral MCA branches are within normal limits. Venous sinuses: Patent. Anatomic variants: Mildly dominant right vertebral artery. Review of the MIP images confirms the above findings IMPRESSION: 1. Negative for large vessel occlusion. No core infarct detected by CT Perfusion. Stable and negative plain CT appearance of the brain. 2. 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 There is contralateral mild to moderate right ICA siphon plaque and stenosis. 3. But there is very little extracranial atherosclerosis, with no significant vertebral artery or cervical carotid stenosis. Electronically Signed: By: Odessa Fleming M.D. On: 05/28/2020 18:32   ECHOCARDIOGRAM COMPLETE BUBBLE STUDY  Result Date: 05/30/2020    ECHOCARDIOGRAM REPORT   Patient Name:   Penny Medina Date of Exam: 05/30/2020 Medical Rec #:  [id number]      Height:       65.0 in Accession #:    [id number]     Weight:       260.6 lb Date of Birth:  1960-11-10      BSA:          2.214 m Patient Age:    59 years       BP:           155/97 mmHg Patient Gender: F              HR:           75 bpm. Exam Location:  [facility] Procedure: 2D Echo, Color Doppler, Cardiac Doppler and Saline Contrast Bubble             Study Indications:     I63.9 Stroke  History:         Patient has no prior history of Echocardiogram examinations.                  Previous Myocardial Infarction; Risk Factors:Hypertension and                  Diabetes.  Sonographer:     Humphrey Rolls RDCS (AE) Referring Phys:  [id number] [facility] Diagnosing Phys: Julien Nordmann MD  Sonographer Comments: Suboptimal subcostal window. Image acquisition challenging due to patient body habitus. IMPRESSIONS  1. Left ventricular ejection fraction, by estimation, is 55 %. The left ventricle has normal function. The left ventricle has no regional wall motion abnormalities. There is mild left ventricular hypertrophy. Left ventricular diastolic parameters are consistent with Grade I diastolic dysfunction (impaired relaxation).  2. Right ventricular systolic function is normal. The right ventricular size is normal.  3. Left atrial size was mildly dilated.  4. Mild mitral valve regurgitation.  5. Agitated saline contrast bubble study was negative, with no evidence of any interatrial shunt. FINDINGS  Left Ventricle: Left ventricular ejection fraction, by estimation, is 55 %. The left ventricle has normal function. The left ventricle has no regional wall motion abnormalities. The left ventricular internal cavity size was normal in size. There is mild  left ventricular hypertrophy. Left ventricular diastolic parameters are consistent with Grade I diastolic dysfunction (impaired relaxation). Right Ventricle: The right ventricular size is normal. No increase in right ventricular wall thickness. Right ventricular systolic function is normal. Left Atrium: Left atrial size was mildly dilated. Right Atrium: Right  atrial size was normal in size. Pericardium: There is no evidence of pericardial effusion. Mitral Valve: The mitral valve is normal in structure. Mild mitral valve regurgitation. No evidence of mitral valve stenosis. MV peak gradient, 3.8 mmHg. The mean mitral valve  gradient is 2.0 mmHg. Tricuspid Valve: The tricuspid valve is normal in structure. Tricuspid valve regurgitation is not demonstrated. No evidence of tricuspid stenosis. Aortic Valve: The aortic valve was not well visualized. Aortic valve regurgitation is not visualized. No aortic stenosis is present. Aortic valve mean gradient measures 3.0 mmHg. Aortic valve peak gradient measures 5.2 mmHg. Aortic valve area, by VTI measures 2.36 cm. Pulmonic Valve: The pulmonic valve was normal in structure. Pulmonic valve regurgitation is trivial. No evidence of pulmonic stenosis. Aorta: The aortic root is normal in size and structure. Venous: The inferior vena cava is normal in size with greater than 50% respiratory variability, suggesting right atrial pressure of 3 mmHg. IAS/Shunts: No atrial level shunt detected by color flow Doppler. Agitated saline contrast was given intravenously to evaluate for intracardiac shunting. Agitated saline contrast bubble study was negative, with no evidence of any interatrial shunt.  LEFT VENTRICLE PLAX 2D LVIDd:         4.43 cm  Diastology LVIDs:         2.90 cm  LV e' medial:    3.59 cm/s LV PW:         1.12 cm  LV E/e' medial:  20.1 LV IVS:        0.98 cm  LV e' lateral:   3.81 cm/s LVOT diam:     2.20 cm  LV E/e' lateral: 19.0 LV SV:         61 LV SV Index:   28 LVOT Area:     3.80 cm  LEFT ATRIUM             Index LA diam:        4.20 cm 1.90 cm/m LA Vol (A2C):   43.7 ml 19.74 ml/m LA Vol (A4C):   79.5 ml 35.91 ml/m LA Biplane Vol: 62.5 ml 28.23 ml/m  AORTIC VALVE                   PULMONIC VALVE AV Area (Vmax):    2.59 cm    PV Vmax:       0.79 m/s AV Area (Vmean):   2.29 cm    PV Vmean:      52.900 cm/s AV Area (VTI):     2.36 cm    PV VTI:        0.105 m AV Vmax:           114.00 cm/s PV Peak grad:  2.5 mmHg AV Vmean:          82.700 cm/s PV Mean grad:  1.0 mmHg AV VTI:            0.259 m AV Peak Grad:      5.2 mmHg AV Mean Grad:      3.0 mmHg LVOT Vmax:         77.60 cm/s LVOT  Vmean:        49.900 cm/s LVOT VTI:          0.161 m LVOT/AV VTI ratio: 0.62  AORTA Ao Root diam: 3.10 cm MITRAL VALVE MV Area (PHT): 4.49 cm    SHUNTS MV Peak grad:  3.8 mmHg    Systemic VTI:  0.16 m MV Mean grad:  2.0  mmHg    Systemic Diam: 2.20 cm MV Vmax:       0.97 m/s MV Vmean:      63.4 cm/s MV Decel Time: 169 msec MV E velocity: 72.30 cm/s MV A velocity: 93.60 cm/s MV E/A ratio:  0.77 Julien Nordmann MD Electronically signed by Julien Nordmann MD Signature Date/Time: 05/30/2020/1:22:20 PM    Final    CT HEAD CODE STROKE WO CONTRAST  Addendum Date: 05/28/2020   ADDENDUM REPORT: 05/28/2020 18:58 ADDENDUM: Study discussed by telephone with Dr. Fanny Bien on 05/28/2020 at 1837 hours. Electronically Signed   By: Odessa Fleming M.D.   On: 05/28/2020 18:58   Result Date: 05/28/2020 CLINICAL DATA:  Code stroke. 59 year old female with sudden onset left side weakness and heaviness. EXAM: CT HEAD WITHOUT CONTRAST CT ANGIOGRAPHY HEAD AND NECK CT PERFUSION BRAIN TECHNIQUE: Multidetector CT imaging of the head, and CTA head and neck was performed using the standard protocol during bolus administration of intravenous contrast. Multiplanar CT image reconstructions and MIPs were obtained to evaluate the vascular anatomy. Carotid stenosis measurements (when applicable) are obtained utilizing NASCET criteria, using the distal internal carotid diameter as the denominator. Multiphase CT imaging of the brain was performed following IV bolus contrast injection. Subsequent parametric perfusion maps were calculated using RAPID software. CONTRAST:  OMNIPAQUE IOHEXOL 350 MG/ML SOLN COMPARISON:  Head CT 05/26/2019. FINDINGS: CT HEAD FINDINGS Brain: Cerebral volume is stable and within normal limits for age. No midline shift, ventriculomegaly, mass effect, evidence of mass lesion, intracranial hemorrhage or evidence of cortically based acute infarction. Gray-white matter differentiation is within normal limits throughout the brain.  Chronic partially empty sella. No cortical encephalomalacia identified. Vascular: Calcified atherosclerosis at the skull base. No suspicious intracranial vascular hyperdensity. Skull: No acute osseous abnormality identified. Sinuses/Orbits: Visualized paranasal sinuses and mastoids are stable and well pneumatized. Other: Visualized orbits and scalp soft tissues are within normal limits. ASPECTS [facility] Stroke Program Early CT Score) Total score (0-10 with 10 being normal): 10 Review of the MIP images confirms the above findings CT Brain Perfusion Findings: ASPECTS: 10 CBF (<30%) Volume: 0mL Perfusion (Tmax>6.0s) volume: 3mL (anterior inferior left temporal lobe, likely artifact) Mismatch Volume: 3mL Infarction Location:Not applicable CTA NECK Skeleton: Absent dentition. Cervical disc and endplate degeneration. No acute osseous abnormality identified. Upper chest: Negative. Other neck: Negative. Aortic arch: 3 vessel arch configuration with mild great vessel origin atherosclerosis. Right carotid system: Soft and calcified plaque in the proximal brachiocephalic artery but no significant stenosis. Negative right CCA origin. Mildly tortuous right CCA. Calcified plaque at the right ICA origin without stenosis. Tortuous vessel distal to the bulb with a kinked appearance (series 9, image 22). Left carotid system: Mild plaque at the left CCA origin without stenosis. Tortuous left CCA. No significant plaque at the left carotid bifurcation or proximal ICA. Tortuous left ICA distal to the bulb similar to the right side with a kinked appearance. Vertebral arteries: Normal proximal right subclavian artery and right vertebral artery origin. The right vertebral artery is patent and within normal limits to the skull base. Mild plaque in the proximal left subclavian artery without stenosis. Normal left vertebral artery origin. Tortuous left V1 segment. The left vertebral is patent and within normal limits to the skull base. CTA  HEAD Posterior circulation: The distal right vertebral artery is mildly dominant especially beyond the left PICA origin. No distal vertebral stenosis. Patent vertebrobasilar junction and basilar artery without stenosis. The right AICA appears dominant. Normal SCA and PCA origins. Posterior  communicating arteries are diminutive or absent. Bilateral PCA branches are within normal limits. Anterior circulation: Both ICA siphons are patent. Moderate calcified plaque of the left siphon cavernous segment with associated moderate to severe stenosis in the distal cavernous segment (radiographic string sign series 6, image 93 and series 7, image 105 just proximal to the anterior genu. Supraclinoid left ICA remains patent and within normal limits. Mild to moderate right cavernous ICA calcified plaque and mild to moderate stenosis (series 5, image 109). Patent and negative supraclinoid right ICA. Patent carotid termini, MCA and ACA origins. Anterior communicating artery is diminutive or absent. Bilateral ACA branches are within normal limits. Left MCA M1 segment and bifurcation are patent without stenosis. Right MCA M1 segment and bifurcation are patent without stenosis. Bilateral MCA branches are within normal limits. Venous sinuses: Patent. Anatomic variants: Mildly dominant right vertebral artery. Review of the MIP images confirms the above findings IMPRESSION: 1. Negative for large vessel occlusion. No core infarct detected by CT Perfusion. Stable and negative plain CT appearance of the brain. 2. 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 There is contralateral mild to moderate right ICA siphon plaque and stenosis. 3. But there is very little extracranial atherosclerosis, with no significant vertebral artery or cervical carotid stenosis. Electronically Signed: By: Odessa Fleming M.D. On: 05/28/2020 18:32       Subjective: No new complaints  Discharge  Exam: Vitals:   05/30/20 1610 05/30/20 1651  BP: (!) 170/87 (!) 158/93  Pulse: 79 90  Resp: 17 18  Temp: 98.5 F (36.9 C) 98.5 F (36.9 C)  SpO2: 99% 100%   Vitals:   05/29/20 2358 05/30/20 0748 05/30/20 1610 05/30/20 1651  BP: 138/73 (!) 155/97 (!) 170/87 (!) 158/93  Pulse: 68 76 79 90  Resp: 19 15 17 18   Temp: 98.1 F (36.7 C) 98 F (36.7 C) 98.5 F (36.9 C) 98.5 F (36.9 C)  TempSrc: Oral Oral Oral   SpO2: 98% 99% 99% 100%  Weight:      Height:        General: Pt is alert, awake, not in acute distress Cardiovascular: RRR, S1/S2 +, no rubs, no gallops Respiratory: CTA bilaterally, no wheezing, no rhonchi Abdominal: Soft, NT, ND, bowel sounds + Extremities: no edema, no cyanosis    The results of significant diagnostics from this hospitalization (including imaging, microbiology, ancillary and laboratory) are listed below for reference.     Microbiology: Recent Results (from the past 240 hour(s))  Respiratory Panel by RT PCR (Flu A&B, Covid) - Nasopharyngeal Swab     Status: None   Collection Time: 05/29/20  3:10 AM   Specimen: Nasopharyngeal Swab  Result Value Ref Range Status   SARS Coronavirus 2 by RT PCR NEGATIVE NEGATIVE Final    Comment: (NOTE) SARS-CoV-2 target nucleic acids are NOT DETECTED.  The SARS-CoV-2 RNA is generally detectable in upper respiratoy specimens during the acute phase of infection. The lowest concentration of SARS-CoV-2 viral copies this assay can detect is 131 copies/mL. A negative result does not preclude SARS-Cov-2 infection and should not be used as the sole basis for treatment or other patient management decisions. A negative result may occur with  improper specimen collection/handling, submission of specimen other than nasopharyngeal swab, presence of viral mutation(s) within the areas targeted by this assay, and inadequate number of viral copies (<131 copies/mL). A negative result must be combined with clinical observations,  patient history, and epidemiological information.  The expected result is Negative.  Fact Sheet for Patients:  https://www.moore.com/  Fact Sheet for Healthcare Providers:  https://www.young.biz/  This test is no t yet approved or cleared by the [place] FDA and  has been authorized for detection and/or diagnosis of SARS-CoV-2 by FDA under an Emergency Use Authorization (EUA). This EUA will remain  in effect (meaning this test can be used) for the duration of the COVID-19 declaration under Section 564(b)(1) of the Act, 21 U.S.C. section 360bbb-3(b)(1), unless the authorization is terminated or revoked sooner.     Influenza A by PCR NEGATIVE NEGATIVE Final   Influenza B by PCR NEGATIVE NEGATIVE Final    Comment: (NOTE) The Xpert Xpress SARS-CoV-2/FLU/RSV assay is intended as an aid in  the diagnosis of influenza from Nasopharyngeal swab specimens and  should not be used as a sole basis for treatment. Nasal washings and  aspirates are unacceptable for Xpert Xpress SARS-CoV-2/FLU/RSV  testing.  Fact Sheet for Patients: https://www.moore.com/  Fact Sheet for Healthcare Providers: https://www.young.biz/  This test is not yet approved or cleared by the [place] FDA and  has been authorized for detection and/or diagnosis of SARS-CoV-2 by  FDA under an Emergency Use Authorization (EUA). This EUA will remain  in effect (meaning this test can be used) for the duration of the  Covid-19 declaration under Section 564(b)(1) of the Act, 21  U.S.C. section 360bbb-3(b)(1), unless the authorization is  terminated or revoked. Performed at [facility], [address] [place], [place] [address]      Labs: BNP (last 3 results) No results for input(s): BNP in the last 8760 hours. Basic Metabolic Panel: Recent Labs  Lab 05/28/20 1730 05/29/20 1623  NA 138  --   K 3.8  --   CL 100  --   CO2  27  --   GLUCOSE 215*  --   BUN 6  --   CREATININE 0.86 1.07*  CALCIUM 9.8  --    Liver Function Tests: Recent Labs  Lab 05/28/20 1730  AST 18  ALT 14  ALKPHOS 72  BILITOT 0.6  PROT 8.5*  ALBUMIN 4.2   No results for input(s): LIPASE, AMYLASE in the last 168 hours. No results for input(s): AMMONIA in the last 168 hours. CBC: Recent Labs  Lab 05/28/20 1730 05/29/20 1623  WBC 6.3 5.6  NEUTROABS 2.8  --   HGB 12.7 11.8*  HCT 42.5 39.9  MCV 66.8* 67.4*  PLT 196 178   Cardiac Enzymes: No results for input(s): CKTOTAL, CKMB, CKMBINDEX, TROPONINI in the last 168 hours. BNP: Invalid input(s): POCBNP CBG: Recent Labs  Lab 05/29/20 2357 05/30/20 0120 05/30/20 0400 05/30/20 0751 05/30/20 1140  GLUCAP 207* 195* 177* 171* 228*   D-Dimer No results for input(s): DDIMER in the last 72 hours. Hgb A1c Recent Labs    05/29/20 0310  HGBA1C 11.8*   Lipid Profile Recent Labs    05/29/20 1514  CHOL 178  HDL 37*  LDLCALC 107*  TRIG 170*  CHOLHDL 4.8   Thyroid function studies No results for input(s): TSH, T4TOTAL, T3FREE, THYROIDAB in the last 72 hours.  Invalid input(s): FREET3 Anemia work up No results for input(s): VITAMINB12, FOLATE, FERRITIN, TIBC, IRON, RETICCTPCT in the last 72 hours. Urinalysis    Component Value Date/Time   COLORURINE YELLOW (A) 06/20/2018 1718   APPEARANCEUR CLEAR (A) 06/20/2018 1718   LABSPEC 1.021 06/20/2018 1718   PHURINE 5.0 06/20/2018 1718   GLUCOSEU 50 (A) 06/20/2018 1718  HGBUR NEGATIVE 06/20/2018 1718   BILIRUBINUR NEGATIVE 06/20/2018 1718   KETONESUR NEGATIVE 06/20/2018 1718   PROTEINUR NEGATIVE 06/20/2018 1718   NITRITE NEGATIVE 06/20/2018 1718   LEUKOCYTESUR LARGE (A) 06/20/2018 1718   Sepsis Labs Invalid input(s): PROCALCITONIN,  WBC,  LACTICIDVEN Microbiology Recent Results (from the past 240 hour(s))  Respiratory Panel by RT PCR (Flu A&B, Covid) - Nasopharyngeal Swab     Status: None   Collection Time:  05/29/20  3:10 AM   Specimen: Nasopharyngeal Swab  Result Value Ref Range Status   SARS Coronavirus 2 by RT PCR NEGATIVE NEGATIVE Final    Comment: (NOTE) SARS-CoV-2 target nucleic acids are NOT DETECTED.  The SARS-CoV-2 RNA is generally detectable in upper respiratoy specimens during the acute phase of infection. The lowest concentration of SARS-CoV-2 viral copies this assay can detect is 131 copies/mL. A negative result does not preclude SARS-Cov-2 infection and should not be used as the sole basis for treatment or other patient management decisions. A negative result may occur with  improper specimen collection/handling, submission of specimen other than nasopharyngeal swab, presence of viral mutation(s) within the areas targeted by this assay, and inadequate number of viral copies (<131 copies/mL). A negative result must be combined with clinical observations, patient history, and epidemiological information. The expected result is Negative.  Fact Sheet for Patients:  https://www.moore.com/  Fact Sheet for Healthcare Providers:  https://www.young.biz/  This test is no t yet approved or cleared by the [place] FDA and  has been authorized for detection and/or diagnosis of SARS-CoV-2 by FDA under an Emergency Use Authorization (EUA). This EUA will remain  in effect (meaning this test can be used) for the duration of the COVID-19 declaration under Section 564(b)(1) of the Act, 21 U.S.C. section 360bbb-3(b)(1), unless the authorization is terminated or revoked sooner.     Influenza A by PCR NEGATIVE NEGATIVE Final   Influenza B by PCR NEGATIVE NEGATIVE Final    Comment: (NOTE) The Xpert Xpress SARS-CoV-2/FLU/RSV assay is intended as an aid in  the diagnosis of influenza from Nasopharyngeal swab specimens and  should not be used as a sole basis for treatment. Nasal washings and  aspirates are unacceptable for Xpert Xpress  SARS-CoV-2/FLU/RSV  testing.  Fact Sheet for Patients: https://www.moore.com/  Fact Sheet for Healthcare Providers: https://www.young.biz/  This test is not yet approved or cleared by the [place] FDA and  has been authorized for detection and/or diagnosis of SARS-CoV-2 by  FDA under an Emergency Use Authorization (EUA). This EUA will remain  in effect (meaning this test can be used) for the duration of the  Covid-19 declaration under Section 564(b)(1) of the Act, 21  U.S.C. section 360bbb-3(b)(1), unless the authorization is  terminated or revoked. Performed at [facility], [address], [place], [place] [address]      Time coordinating discharge: Over 30 minutes  SIGNED:   Lynn Ito, MD  [facility] 05/30/2020, 5:04 PM Pager   If 7PM-7AM, please contact night-coverage [email or web address] Password [email or web address]

## 2020-05-30 NOTE — Progress Notes (Signed)
DISCHARGE NOTE:  Pt given discharge instructions, and walker. Pt verbalized understanding Pt waiting on ride.

## 2020-05-30 NOTE — Progress Notes (Signed)
*  PRELIMINARY RESULTS* Echocardiogram 2D Echocardiogram has been performed.  Joanette Gula Francesa Eugenio 05/30/2020, 9:18 AM

## 2020-05-30 NOTE — Progress Notes (Signed)
Subjective: Patient reports no change in symptoms  Objective: Current vital signs: BP (!) 155/97 (BP Location: Right Arm)   Pulse 76   Temp 98 F (36.7 C) (Oral)   Resp 15   Ht $R'5\' 5"'gh$  (1.651 m)   Wt 118.2 kg   SpO2 99%   BMI 43.36 kg/m  Vital signs in last 24 hours: Temp:  [98 F (36.7 C)-98.8 F (37.1 C)] 98 F (36.7 C) (10/15 0748) Pulse Rate:  [68-76] 76 (10/15 0748) Resp:  [15-19] 15 (10/15 0748) BP: (138-158)/(73-97) 155/97 (10/15 0748) SpO2:  [97 %-99 %] 99 % (10/15 0748)  Intake/Output from previous day: 10/14 0701 - 10/15 0700 In: 691.6 [I.V.:691.6] Out: 1 [Urine:1] Intake/Output this shift: No intake/output data recorded. Nutritional status:  Diet Order            Diet Carb Modified Fluid consistency: Thin; Room service appropriate? Yes  Diet effective now                 Neurologic Exam: Mental Status: Alert, oriented, thought content appropriate. Speech fluent with some word finding difficulties at times.Able to follow 3 step commands without difficulty. Cranial Nerves: II: Visual fields grossly normal, pupils equal, round, reactive to light and accommodation III,IV, VI: ptosis not present, extra-ocular motions intact bilaterally V,VII: smile symmetric, facial light touch sensation normal bilaterally VIII: hearing normal bilaterally IX,X: gag reflex present XI: bilateral shoulder shrug XII: midline tongue extension Motor: Right :Upper extremity 5/5Left: Upper extremity 5/5 Lower extremity 4+/5Lower extremity 4+/5 Tone and bulk:normal tone throughout; no atrophy noted Sensory: Pinprick and light touch intact throughout, bilaterally   Lab Results: Basic Metabolic Panel: Recent Labs  Lab 05/28/20 1730 05/29/20 1623  NA 138  --   K 3.8  --   CL 100  --   CO2 27  --   GLUCOSE 215*  --   BUN 6  --   CREATININE 0.86  1.07*  CALCIUM 9.8  --     Liver Function Tests: Recent Labs  Lab 05/28/20 1730  AST 18  ALT 14  ALKPHOS 72  BILITOT 0.6  PROT 8.5*  ALBUMIN 4.2   No results for input(s): LIPASE, AMYLASE in the last 168 hours. No results for input(s): AMMONIA in the last 168 hours.  CBC: Recent Labs  Lab 05/28/20 1730 05/29/20 1623  WBC 6.3 5.6  NEUTROABS 2.8  --   HGB 12.7 11.8*  HCT 42.5 39.9  MCV 66.8* 67.4*  PLT 196 178    Cardiac Enzymes: No results for input(s): CKTOTAL, CKMB, CKMBINDEX, TROPONINI in the last 168 hours.  Lipid Panel: Recent Labs  Lab 05/29/20 1514  CHOL 178  TRIG 170*  HDL 37*  CHOLHDL 4.8  VLDL 34  LDLCALC 107*    CBG: Recent Labs  Lab 05/29/20 2038 05/29/20 2357 05/30/20 0120 05/30/20 0400 05/30/20 0751  GLUCAP 239* 207* 195* 177* 171*    Microbiology: Results for orders placed or performed during the hospital encounter of 05/28/20  Respiratory Panel by RT PCR (Flu A&B, Covid) - Nasopharyngeal Swab     Status: None   Collection Time: 05/29/20  3:10 AM   Specimen: Nasopharyngeal Swab  Result Value Ref Range Status   SARS Coronavirus 2 by RT PCR NEGATIVE NEGATIVE Final    Comment: (NOTE) SARS-CoV-2 target nucleic acids are NOT DETECTED.  The SARS-CoV-2 RNA is generally detectable in upper respiratoy specimens during the acute phase of infection. The lowest concentration of SARS-CoV-2 viral copies this assay  can detect is 131 copies/mL. A negative result does not preclude SARS-Cov-2 infection and should not be used as the sole basis for treatment or other patient management decisions. A negative result may occur with  improper specimen collection/handling, submission of specimen other than nasopharyngeal swab, presence of viral mutation(s) within the areas targeted by this assay, and inadequate number of viral copies (<131 copies/mL). A negative result must be combined with clinical observations, patient history, and epidemiological  information. The expected result is Negative.  Fact Sheet for Patients:  PinkCheek.be  Fact Sheet for Healthcare Providers:  GravelBags.it  This test is no t yet approved or cleared by the [place] FDA and  has been authorized for detection and/or diagnosis of SARS-CoV-2 by FDA under an Emergency Use Authorization (EUA). This EUA will remain  in effect (meaning this test can be used) for the duration of the COVID-19 declaration under Section 564(b)(1) of the Act, 21 U.S.C. section 360bbb-3(b)(1), unless the authorization is terminated or revoked sooner.     Influenza A by PCR NEGATIVE NEGATIVE Final   Influenza B by PCR NEGATIVE NEGATIVE Final    Comment: (NOTE) The Xpert Xpress SARS-CoV-2/FLU/RSV assay is intended as an aid in  the diagnosis of influenza from Nasopharyngeal swab specimens and  should not be used as a sole basis for treatment. Nasal washings and  aspirates are unacceptable for Xpert Xpress SARS-CoV-2/FLU/RSV  testing.  Fact Sheet for Patients: PinkCheek.be  Fact Sheet for Healthcare Providers: GravelBags.it  This test is not yet approved or cleared by the [place] FDA and  has been authorized for detection and/or diagnosis of SARS-CoV-2 by  FDA under an Emergency Use Authorization (EUA). This EUA will remain  in effect (meaning this test can be used) for the duration of the  Covid-19 declaration under Section 564(b)(1) of the Act, 21  U.S.C. section 360bbb-3(b)(1), unless the authorization is  terminated or revoked. Performed at [facility], [address], [place], [place] [address]     Coagulation Studies: Recent Labs    05/28/20 1730  LABPROT 12.8  INR 1.0    Imaging: CT Angio Head W or Wo Contrast  Addendum Date: 05/28/2020   ADDENDUM REPORT: 05/28/2020 18:58 ADDENDUM: Study discussed by telephone with Dr.  Jacqualine Code on 05/28/2020 at 1837 hours. Electronically Signed   By: Genevie Ann M.D.   On: 05/28/2020 18:58   Result Date: 05/28/2020 CLINICAL DATA:  Code stroke. 59 year old female with sudden onset left side weakness and heaviness. EXAM: CT HEAD WITHOUT CONTRAST CT ANGIOGRAPHY HEAD AND NECK CT PERFUSION BRAIN TECHNIQUE: Multidetector CT imaging of the head, and CTA head and neck was performed using the standard protocol during bolus administration of intravenous contrast. Multiplanar CT image reconstructions and MIPs were obtained to evaluate the vascular anatomy. Carotid stenosis measurements (when applicable) are obtained utilizing NASCET criteria, using the distal internal carotid diameter as the denominator. Multiphase CT imaging of the brain was performed following IV bolus contrast injection. Subsequent parametric perfusion maps were calculated using RAPID software. CONTRAST:  154mL OMNIPAQUE IOHEXOL 350 MG/ML SOLN COMPARISON:  Head CT 05/26/2019. FINDINGS: CT HEAD FINDINGS Brain: Cerebral volume is stable and within normal limits for age. No midline shift, ventriculomegaly, mass effect, evidence of mass lesion, intracranial hemorrhage or evidence of cortically based acute infarction. Gray-white matter differentiation is within normal limits throughout the brain. Chronic partially empty sella. No cortical encephalomalacia identified. Vascular: Calcified atherosclerosis at the skull base. No suspicious intracranial vascular hyperdensity. Skull: No acute  osseous abnormality identified. Sinuses/Orbits: Visualized paranasal sinuses and mastoids are stable and well pneumatized. Other: Visualized orbits and scalp soft tissues are within normal limits. ASPECTS [facility] Stroke Program Early CT Score) Total score (0-10 with 10 being normal): 10 Review of the MIP images confirms the above findings CT Brain Perfusion Findings: ASPECTS: 10 CBF (<30%) Volume: 31mL Perfusion (Tmax>6.0s) volume: 89mL (anterior inferior left  temporal lobe, likely artifact) Mismatch Volume: 26mL Infarction Location:Not applicable CTA NECK Skeleton: Absent dentition. Cervical disc and endplate degeneration. No acute osseous abnormality identified. Upper chest: Negative. Other neck: Negative. Aortic arch: 3 vessel arch configuration with mild great vessel origin atherosclerosis. Right carotid system: Soft and calcified plaque in the proximal brachiocephalic artery but no significant stenosis. Negative right CCA origin. Mildly tortuous right CCA. Calcified plaque at the right ICA origin without stenosis. Tortuous vessel distal to the bulb with a kinked appearance (series 9, image 22). Left carotid system: Mild plaque at the left CCA origin without stenosis. Tortuous left CCA. No significant plaque at the left carotid bifurcation or proximal ICA. Tortuous left ICA distal to the bulb similar to the right side with a kinked appearance. Vertebral arteries: Normal proximal right subclavian artery and right vertebral artery origin. The right vertebral artery is patent and within normal limits to the skull base. Mild plaque in the proximal left subclavian artery without stenosis. Normal left vertebral artery origin. Tortuous left V1 segment. The left vertebral is patent and within normal limits to the skull base. CTA HEAD Posterior circulation: The distal right vertebral artery is mildly dominant especially beyond the left PICA origin. No distal vertebral stenosis. Patent vertebrobasilar junction and basilar artery without stenosis. The right AICA appears dominant. Normal SCA and PCA origins. Posterior communicating arteries are diminutive or absent. Bilateral PCA branches are within normal limits. Anterior circulation: Both ICA siphons are patent. Moderate calcified plaque of the left siphon cavernous segment with associated moderate to severe stenosis in the distal cavernous segment (radiographic string sign series 6, image 93 and series 7, image 105 just  proximal to the anterior genu. Supraclinoid left ICA remains patent and within normal limits. Mild to moderate right cavernous ICA calcified plaque and mild to moderate stenosis (series 5, image 109). Patent and negative supraclinoid right ICA. Patent carotid termini, MCA and ACA origins. Anterior communicating artery is diminutive or absent. Bilateral ACA branches are within normal limits. Left MCA M1 segment and bifurcation are patent without stenosis. Right MCA M1 segment and bifurcation are patent without stenosis. Bilateral MCA branches are within normal limits. Venous sinuses: Patent. Anatomic variants: Mildly dominant right vertebral artery. Review of the MIP images confirms the above findings IMPRESSION: 1. Negative for large vessel occlusion. No core infarct detected by CT Perfusion. Stable and negative plain CT appearance of the brain. 2. 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 There is contralateral mild to moderate right ICA siphon plaque and stenosis. 3. But there is very little extracranial atherosclerosis, with no significant vertebral artery or cervical carotid stenosis. Electronically Signed: By: Odessa Fleming M.D. On: 05/28/2020 18:32   DG Chest 2 View  Result Date: 05/28/2020 CLINICAL DATA:  Left-sided weakness and headache.  Hypertension EXAM: CHEST - 2 VIEW COMPARISON:  CT angiogram chest July 26, 2019 FINDINGS: Lungs are clear. There is cardiomegaly with pulmonary vascularity normal. No adenopathy. No bone lesions. IMPRESSION: Cardiomegaly.  Lungs clear.  No adenopathy evident. Electronically Signed   By: Bretta Bang  III M.D.   On: 05/28/2020 19:27   CT Angio Neck W and/or Wo Contrast  Addendum Date: 05/28/2020   ADDENDUM REPORT: 05/28/2020 18:58 ADDENDUM: Study discussed by telephone with Dr. Jacqualine Code on 05/28/2020 at 1837 hours. Electronically Signed   By: Genevie Ann M.D.   On: 05/28/2020 18:58   Result Date:  05/28/2020 CLINICAL DATA:  Code stroke. 59 year old female with sudden onset left side weakness and heaviness. EXAM: CT HEAD WITHOUT CONTRAST CT ANGIOGRAPHY HEAD AND NECK CT PERFUSION BRAIN TECHNIQUE: Multidetector CT imaging of the head, and CTA head and neck was performed using the standard protocol during bolus administration of intravenous contrast. Multiplanar CT image reconstructions and MIPs were obtained to evaluate the vascular anatomy. Carotid stenosis measurements (when applicable) are obtained utilizing NASCET criteria, using the distal internal carotid diameter as the denominator. Multiphase CT imaging of the brain was performed following IV bolus contrast injection. Subsequent parametric perfusion maps were calculated using RAPID software. CONTRAST:  197mL OMNIPAQUE IOHEXOL 350 MG/ML SOLN COMPARISON:  Head CT 05/26/2019. FINDINGS: CT HEAD FINDINGS Brain: Cerebral volume is stable and within normal limits for age. No midline shift, ventriculomegaly, mass effect, evidence of mass lesion, intracranial hemorrhage or evidence of cortically based acute infarction. Gray-white matter differentiation is within normal limits throughout the brain. Chronic partially empty sella. No cortical encephalomalacia identified. Vascular: Calcified atherosclerosis at the skull base. No suspicious intracranial vascular hyperdensity. Skull: No acute osseous abnormality identified. Sinuses/Orbits: Visualized paranasal sinuses and mastoids are stable and well pneumatized. Other: Visualized orbits and scalp soft tissues are within normal limits. ASPECTS [facility] Stroke Program Early CT Score) Total score (0-10 with 10 being normal): 10 Review of the MIP images confirms the above findings CT Brain Perfusion Findings: ASPECTS: 10 CBF (<30%) Volume: 16mL Perfusion (Tmax>6.0s) volume: 37mL (anterior inferior left temporal lobe, likely artifact) Mismatch Volume: 60mL Infarction Location:Not applicable CTA NECK Skeleton: Absent  dentition. Cervical disc and endplate degeneration. No acute osseous abnormality identified. Upper chest: Negative. Other neck: Negative. Aortic arch: 3 vessel arch configuration with mild great vessel origin atherosclerosis. Right carotid system: Soft and calcified plaque in the proximal brachiocephalic artery but no significant stenosis. Negative right CCA origin. Mildly tortuous right CCA. Calcified plaque at the right ICA origin without stenosis. Tortuous vessel distal to the bulb with a kinked appearance (series 9, image 22). Left carotid system: Mild plaque at the left CCA origin without stenosis. Tortuous left CCA. No significant plaque at the left carotid bifurcation or proximal ICA. Tortuous left ICA distal to the bulb similar to the right side with a kinked appearance. Vertebral arteries: Normal proximal right subclavian artery and right vertebral artery origin. The right vertebral artery is patent and within normal limits to the skull base. Mild plaque in the proximal left subclavian artery without stenosis. Normal left vertebral artery origin. Tortuous left V1 segment. The left vertebral is patent and within normal limits to the skull base. CTA HEAD Posterior circulation: The distal right vertebral artery is mildly dominant especially beyond the left PICA origin. No distal vertebral stenosis. Patent vertebrobasilar junction and basilar artery without stenosis. The right AICA appears dominant. Normal SCA and PCA origins. Posterior communicating arteries are diminutive or absent. Bilateral PCA branches are within normal limits. Anterior circulation: Both ICA siphons are patent. Moderate calcified plaque of the left siphon cavernous segment with associated moderate to severe stenosis in the distal cavernous segment (radiographic string sign series 6, image 93 and series 7, image 105 just proximal to the anterior  genu. Supraclinoid left ICA remains patent and within normal limits. Mild to moderate right  cavernous ICA calcified plaque and mild to moderate stenosis (series 5, image 109). Patent and negative supraclinoid right ICA. Patent carotid termini, MCA and ACA origins. Anterior communicating artery is diminutive or absent. Bilateral ACA branches are within normal limits. Left MCA M1 segment and bifurcation are patent without stenosis. Right MCA M1 segment and bifurcation are patent without stenosis. Bilateral MCA branches are within normal limits. Venous sinuses: Patent. Anatomic variants: Mildly dominant right vertebral artery. Review of the MIP images confirms the above findings IMPRESSION: 1. Negative for large vessel occlusion. No core infarct detected by CT Perfusion. Stable and negative plain CT appearance of the brain. 2. 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 There is contralateral mild to moderate right ICA siphon plaque and stenosis. 3. But there is very little extracranial atherosclerosis, with no significant vertebral artery or cervical carotid stenosis. Electronically Signed: By: Odessa Fleming M.D. On: 05/28/2020 18:32   MR BRAIN WO CONTRAST  Result Date: 05/29/2020 CLINICAL DATA:  Neuro deficit, acute, stroke suspected EXAM: MRI HEAD WITHOUT CONTRAST TECHNIQUE: Multiplanar, multiecho pulse sequences of the brain and surrounding structures were obtained without intravenous contrast. COMPARISON:  Brain MRI 05/29/2020 at 1:40 p.m. FINDINGS: Brain: No acute infarct, acute hemorrhage or extra-axial collection. A few scattered foci of T2WI-hyperintensity in the white matter, nonspecific. Normal volume of CSF spaces. No chronic microhemorrhage. Normal midline structures. Vascular: Normal flow voids. Skull and upper cervical spine: Normal marrow signal. Sinuses/Orbits: Negative. Other: None. IMPRESSION: Normal MRI of the brain. Electronically Signed   By: Deatra Robinson M.D.   On: 05/29/2020 22:56   MR BRAIN WO CONTRAST  Result Date:  05/29/2020 CLINICAL DATA:  Abnormal speech, left-sided weakness EXAM: MRI HEAD WITHOUT CONTRAST TECHNIQUE: Multiplanar, multiecho pulse sequences of the brain and surrounding structures were obtained without intravenous contrast. COMPARISON:  None. FINDINGS: Brain: There is no acute infarction or intracranial hemorrhage. There is no intracranial mass, mass effect, or edema. There is no hydrocephalus or extra-axial fluid collection. There are minimal small foci of T2 hyperintensity in the supratentorial white matter likely reflecting nonspecific gliosis/demyelination potentially due to chronic microvascular ischemic changes in a patient of this age. Ventricles and sulci are normal in size and configuration. Vascular: Major vessel flow voids at the skull base are preserved. Skull and upper cervical spine: Normal marrow signal is preserved. Sinuses/Orbits: Paranasal sinuses are aerated. Orbits are unremarkable. Other: Sella is partially empty.  Mastoid air cells are clear. IMPRESSION: No evidence of recent infarction, hemorrhage, or mass. Probable minor chronic microvascular ischemic changes. Electronically Signed   By: Guadlupe Spanish M.D.   On: 05/29/2020 14:19   MR CERVICAL SPINE WO CONTRAST  Result Date: 05/29/2020 CLINICAL DATA:  Low back pain and progressive neurologic deficits. EXAM: MRI CERVICAL, THORACIC AND LUMBAR SPINE WITHOUT CONTRAST TECHNIQUE: Multiplanar and multiecho pulse sequences of the cervical spine, to include the craniocervical junction and cervicothoracic junction, and thoracic and lumbar spine, were obtained without intravenous contrast. COMPARISON:  Cervical spine MRI 09/05/2019 FINDINGS: MRI CERVICAL SPINE FINDINGS Alignment: Grade 1 retrolisthesis at C5-6. Straightening of normal cervical lordosis. Vertebrae: No acute abnormality. Cord: Hyperintense T2-weighted signal within the spinal cord at the C4-5 level, unchanged. Posterior Fossa, vertebral arteries, paraspinal tissues: Negative.  Disc levels: C1-2: Unremarkable. C2-3: Normal disc space and facet joints. There is no spinal canal stenosis. No neural foraminal stenosis. C3-4: Normal  disc space and facet joints. There is no spinal canal stenosis. No neural foraminal stenosis. C4-5: Intermediate sized disc bulge. Moderate spinal canal stenosis. Mild right neural foraminal stenosis. C5-6: Right-greater-than-left uncovertebral hypertrophy and small disc bulge. There is no spinal canal stenosis. Moderate right and mild left neural foraminal stenosis. C6-7: Right uncovertebral hypertrophy. There is no spinal canal stenosis. Moderate right neural foraminal stenosis. C7-T1: Normal disc space and facet joints. There is no spinal canal stenosis. No neural foraminal stenosis. MRI THORACIC SPINE FINDINGS Alignment:  Physiologic. Vertebrae: No fracture, evidence of discitis, or bone lesion. Cord:  Normal signal and morphology. Paraspinal and other soft tissues: Negative. Disc levels: Mild degenerative disc disease without spinal canal or neural foraminal stenosis. MRI LUMBAR SPINE FINDINGS Segmentation:  Standard. Alignment:  Physiologic. Vertebrae:  No fracture, evidence of discitis, or bone lesion. Conus medullaris and cauda equina: Conus extends to the L1 level. Conus and cauda equina appear normal. Paraspinal and other soft tissues: Negative. Disc levels: Disc levels above L5 are normal. L5-S1: Disc space narrowing with endplate spurring and intermediate left asymmetric bulge. There is narrowing of the lateral recesses and severe left foraminal stenosis. Mild right foraminal stenosis. IMPRESSION: 1. No acute abnormality of the cervical, thoracic or lumbar spine. 2. Unchanged hyperintense T2-weighted signal within the spinal cord at the C4-5 level is consistent with myelomalacia. 3. Moderate right C5-6 and C6-7 neural foraminal stenosis. 4. Left asymmetric disc bulge at L5-S1 with narrowing of the lateral recesses and severe left neural foraminal  stenosis. Electronically Signed   By: Ulyses Jarred M.D.   On: 05/29/2020 22:42   MR THORACIC SPINE WO CONTRAST  Result Date: 05/29/2020 CLINICAL DATA:  Low back pain and progressive neurologic deficits. EXAM: MRI CERVICAL, THORACIC AND LUMBAR SPINE WITHOUT CONTRAST TECHNIQUE: Multiplanar and multiecho pulse sequences of the cervical spine, to include the craniocervical junction and cervicothoracic junction, and thoracic and lumbar spine, were obtained without intravenous contrast. COMPARISON:  Cervical spine MRI 09/05/2019 FINDINGS: MRI CERVICAL SPINE FINDINGS Alignment: Grade 1 retrolisthesis at C5-6. Straightening of normal cervical lordosis. Vertebrae: No acute abnormality. Cord: Hyperintense T2-weighted signal within the spinal cord at the C4-5 level, unchanged. Posterior Fossa, vertebral arteries, paraspinal tissues: Negative. Disc levels: C1-2: Unremarkable. C2-3: Normal disc space and facet joints. There is no spinal canal stenosis. No neural foraminal stenosis. C3-4: Normal disc space and facet joints. There is no spinal canal stenosis. No neural foraminal stenosis. C4-5: Intermediate sized disc bulge. Moderate spinal canal stenosis. Mild right neural foraminal stenosis. C5-6: Right-greater-than-left uncovertebral hypertrophy and small disc bulge. There is no spinal canal stenosis. Moderate right and mild left neural foraminal stenosis. C6-7: Right uncovertebral hypertrophy. There is no spinal canal stenosis. Moderate right neural foraminal stenosis. C7-T1: Normal disc space and facet joints. There is no spinal canal stenosis. No neural foraminal stenosis. MRI THORACIC SPINE FINDINGS Alignment:  Physiologic. Vertebrae: No fracture, evidence of discitis, or bone lesion. Cord:  Normal signal and morphology. Paraspinal and other soft tissues: Negative. Disc levels: Mild degenerative disc disease without spinal canal or neural foraminal stenosis. MRI LUMBAR SPINE FINDINGS Segmentation:  Standard.  Alignment:  Physiologic. Vertebrae:  No fracture, evidence of discitis, or bone lesion. Conus medullaris and cauda equina: Conus extends to the L1 level. Conus and cauda equina appear normal. Paraspinal and other soft tissues: Negative. Disc levels: Disc levels above L5 are normal. L5-S1: Disc space narrowing with endplate spurring and intermediate left asymmetric bulge. There is narrowing of the lateral recesses and severe left  foraminal stenosis. Mild right foraminal stenosis. IMPRESSION: 1. No acute abnormality of the cervical, thoracic or lumbar spine. 2. Unchanged hyperintense T2-weighted signal within the spinal cord at the C4-5 level is consistent with myelomalacia. 3. Moderate right C5-6 and C6-7 neural foraminal stenosis. 4. Left asymmetric disc bulge at L5-S1 with narrowing of the lateral recesses and severe left neural foraminal stenosis. Electronically Signed   By: Ulyses Jarred M.D.   On: 05/29/2020 22:42   MR LUMBAR SPINE WO CONTRAST  Result Date: 05/29/2020 CLINICAL DATA:  Low back pain and progressive neurologic deficits. EXAM: MRI CERVICAL, THORACIC AND LUMBAR SPINE WITHOUT CONTRAST TECHNIQUE: Multiplanar and multiecho pulse sequences of the cervical spine, to include the craniocervical junction and cervicothoracic junction, and thoracic and lumbar spine, were obtained without intravenous contrast. COMPARISON:  Cervical spine MRI 09/05/2019 FINDINGS: MRI CERVICAL SPINE FINDINGS Alignment: Grade 1 retrolisthesis at C5-6. Straightening of normal cervical lordosis. Vertebrae: No acute abnormality. Cord: Hyperintense T2-weighted signal within the spinal cord at the C4-5 level, unchanged. Posterior Fossa, vertebral arteries, paraspinal tissues: Negative. Disc levels: C1-2: Unremarkable. C2-3: Normal disc space and facet joints. There is no spinal canal stenosis. No neural foraminal stenosis. C3-4: Normal disc space and facet joints. There is no spinal canal stenosis. No neural foraminal stenosis.  C4-5: Intermediate sized disc bulge. Moderate spinal canal stenosis. Mild right neural foraminal stenosis. C5-6: Right-greater-than-left uncovertebral hypertrophy and small disc bulge. There is no spinal canal stenosis. Moderate right and mild left neural foraminal stenosis. C6-7: Right uncovertebral hypertrophy. There is no spinal canal stenosis. Moderate right neural foraminal stenosis. C7-T1: Normal disc space and facet joints. There is no spinal canal stenosis. No neural foraminal stenosis. MRI THORACIC SPINE FINDINGS Alignment:  Physiologic. Vertebrae: No fracture, evidence of discitis, or bone lesion. Cord:  Normal signal and morphology. Paraspinal and other soft tissues: Negative. Disc levels: Mild degenerative disc disease without spinal canal or neural foraminal stenosis. MRI LUMBAR SPINE FINDINGS Segmentation:  Standard. Alignment:  Physiologic. Vertebrae:  No fracture, evidence of discitis, or bone lesion. Conus medullaris and cauda equina: Conus extends to the L1 level. Conus and cauda equina appear normal. Paraspinal and other soft tissues: Negative. Disc levels: Disc levels above L5 are normal. L5-S1: Disc space narrowing with endplate spurring and intermediate left asymmetric bulge. There is narrowing of the lateral recesses and severe left foraminal stenosis. Mild right foraminal stenosis. IMPRESSION: 1. No acute abnormality of the cervical, thoracic or lumbar spine. 2. Unchanged hyperintense T2-weighted signal within the spinal cord at the C4-5 level is consistent with myelomalacia. 3. Moderate right C5-6 and C6-7 neural foraminal stenosis. 4. Left asymmetric disc bulge at L5-S1 with narrowing of the lateral recesses and severe left neural foraminal stenosis. Electronically Signed   By: Ulyses Jarred M.D.   On: 05/29/2020 22:42   CT CEREBRAL PERFUSION W CONTRAST  Addendum Date: 05/28/2020   ADDENDUM REPORT: 05/28/2020 18:58 ADDENDUM: Study discussed by telephone with Dr. Jacqualine Code on 05/28/2020 at  1837 hours. Electronically Signed   By: Genevie Ann M.D.   On: 05/28/2020 18:58   Result Date: 05/28/2020 CLINICAL DATA:  Code stroke. 59 year old female with sudden onset left side weakness and heaviness. EXAM: CT HEAD WITHOUT CONTRAST CT ANGIOGRAPHY HEAD AND NECK CT PERFUSION BRAIN TECHNIQUE: Multidetector CT imaging of the head, and CTA head and neck was performed using the standard protocol during bolus administration of intravenous contrast. Multiplanar CT image reconstructions and MIPs were obtained to evaluate the vascular anatomy. Carotid stenosis measurements (when applicable) are  obtained utilizing NASCET criteria, using the distal internal carotid diameter as the denominator. Multiphase CT imaging of the brain was performed following IV bolus contrast injection. Subsequent parametric perfusion maps were calculated using RAPID software. CONTRAST:  113mL OMNIPAQUE IOHEXOL 350 MG/ML SOLN COMPARISON:  Head CT 05/26/2019. FINDINGS: CT HEAD FINDINGS Brain: Cerebral volume is stable and within normal limits for age. No midline shift, ventriculomegaly, mass effect, evidence of mass lesion, intracranial hemorrhage or evidence of cortically based acute infarction. Gray-white matter differentiation is within normal limits throughout the brain. Chronic partially empty sella. No cortical encephalomalacia identified. Vascular: Calcified atherosclerosis at the skull base. No suspicious intracranial vascular hyperdensity. Skull: No acute osseous abnormality identified. Sinuses/Orbits: Visualized paranasal sinuses and mastoids are stable and well pneumatized. Other: Visualized orbits and scalp soft tissues are within normal limits. ASPECTS [facility] Stroke Program Early CT Score) Total score (0-10 with 10 being normal): 10 Review of the MIP images confirms the above findings CT Brain Perfusion Findings: ASPECTS: 10 CBF (<30%) Volume: 49mL Perfusion (Tmax>6.0s) volume: 68mL (anterior inferior left temporal lobe, likely  artifact) Mismatch Volume: 87mL Infarction Location:Not applicable CTA NECK Skeleton: Absent dentition. Cervical disc and endplate degeneration. No acute osseous abnormality identified. Upper chest: Negative. Other neck: Negative. Aortic arch: 3 vessel arch configuration with mild great vessel origin atherosclerosis. Right carotid system: Soft and calcified plaque in the proximal brachiocephalic artery but no significant stenosis. Negative right CCA origin. Mildly tortuous right CCA. Calcified plaque at the right ICA origin without stenosis. Tortuous vessel distal to the bulb with a kinked appearance (series 9, image 22). Left carotid system: Mild plaque at the left CCA origin without stenosis. Tortuous left CCA. No significant plaque at the left carotid bifurcation or proximal ICA. Tortuous left ICA distal to the bulb similar to the right side with a kinked appearance. Vertebral arteries: Normal proximal right subclavian artery and right vertebral artery origin. The right vertebral artery is patent and within normal limits to the skull base. Mild plaque in the proximal left subclavian artery without stenosis. Normal left vertebral artery origin. Tortuous left V1 segment. The left vertebral is patent and within normal limits to the skull base. CTA HEAD Posterior circulation: The distal right vertebral artery is mildly dominant especially beyond the left PICA origin. No distal vertebral stenosis. Patent vertebrobasilar junction and basilar artery without stenosis. The right AICA appears dominant. Normal SCA and PCA origins. Posterior communicating arteries are diminutive or absent. Bilateral PCA branches are within normal limits. Anterior circulation: Both ICA siphons are patent. Moderate calcified plaque of the left siphon cavernous segment with associated moderate to severe stenosis in the distal cavernous segment (radiographic string sign series 6, image 93 and series 7, image 105 just proximal to the anterior  genu. Supraclinoid left ICA remains patent and within normal limits. Mild to moderate right cavernous ICA calcified plaque and mild to moderate stenosis (series 5, image 109). Patent and negative supraclinoid right ICA. Patent carotid termini, MCA and ACA origins. Anterior communicating artery is diminutive or absent. Bilateral ACA branches are within normal limits. Left MCA M1 segment and bifurcation are patent without stenosis. Right MCA M1 segment and bifurcation are patent without stenosis. Bilateral MCA branches are within normal limits. Venous sinuses: Patent. Anatomic variants: Mildly dominant right vertebral artery. Review of the MIP images confirms the above findings IMPRESSION: 1. Negative for large vessel occlusion. No core infarct detected by CT Perfusion. Stable and negative plain CT appearance of the brain. 2. 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 There is contralateral mild to moderate right ICA siphon plaque and stenosis. 3. But there is very little extracranial atherosclerosis, with no significant vertebral artery or cervical carotid stenosis. Electronically Signed: By: Genevie Ann M.D. On: 05/28/2020 18:32   CT HEAD CODE STROKE WO CONTRAST  Addendum Date: 05/28/2020   ADDENDUM REPORT: 05/28/2020 18:58 ADDENDUM: Study discussed by telephone with Dr. Jacqualine Code on 05/28/2020 at 1837 hours. Electronically Signed   By: Genevie Ann M.D.   On: 05/28/2020 18:58   Result Date: 05/28/2020 CLINICAL DATA:  Code stroke. 59 year old female with sudden onset left side weakness and heaviness. EXAM: CT HEAD WITHOUT CONTRAST CT ANGIOGRAPHY HEAD AND NECK CT PERFUSION BRAIN TECHNIQUE: Multidetector CT imaging of the head, and CTA head and neck was performed using the standard protocol during bolus administration of intravenous contrast. Multiplanar CT image reconstructions and MIPs were obtained to evaluate the vascular anatomy. Carotid stenosis  measurements (when applicable) are obtained utilizing NASCET criteria, using the distal internal carotid diameter as the denominator. Multiphase CT imaging of the brain was performed following IV bolus contrast injection. Subsequent parametric perfusion maps were calculated using RAPID software. CONTRAST:  151mL OMNIPAQUE IOHEXOL 350 MG/ML SOLN COMPARISON:  Head CT 05/26/2019. FINDINGS: CT HEAD FINDINGS Brain: Cerebral volume is stable and within normal limits for age. No midline shift, ventriculomegaly, mass effect, evidence of mass lesion, intracranial hemorrhage or evidence of cortically based acute infarction. Gray-white matter differentiation is within normal limits throughout the brain. Chronic partially empty sella. No cortical encephalomalacia identified. Vascular: Calcified atherosclerosis at the skull base. No suspicious intracranial vascular hyperdensity. Skull: No acute osseous abnormality identified. Sinuses/Orbits: Visualized paranasal sinuses and mastoids are stable and well pneumatized. Other: Visualized orbits and scalp soft tissues are within normal limits. ASPECTS [facility] Stroke Program Early CT Score) Total score (0-10 with 10 being normal): 10 Review of the MIP images confirms the above findings CT Brain Perfusion Findings: ASPECTS: 10 CBF (<30%) Volume: 63mL Perfusion (Tmax>6.0s) volume: 51mL (anterior inferior left temporal lobe, likely artifact) Mismatch Volume: 56mL Infarction Location:Not applicable CTA NECK Skeleton: Absent dentition. Cervical disc and endplate degeneration. No acute osseous abnormality identified. Upper chest: Negative. Other neck: Negative. Aortic arch: 3 vessel arch configuration with mild great vessel origin atherosclerosis. Right carotid system: Soft and calcified plaque in the proximal brachiocephalic artery but no significant stenosis. Negative right CCA origin. Mildly tortuous right CCA. Calcified plaque at the right ICA origin without stenosis. Tortuous vessel  distal to the bulb with a kinked appearance (series 9, image 22). Left carotid system: Mild plaque at the left CCA origin without stenosis. Tortuous left CCA. No significant plaque at the left carotid bifurcation or proximal ICA. Tortuous left ICA distal to the bulb similar to the right side with a kinked appearance. Vertebral arteries: Normal proximal right subclavian artery and right vertebral artery origin. The right vertebral artery is patent and within normal limits to the skull base. Mild plaque in the proximal left subclavian artery without stenosis. Normal left vertebral artery origin. Tortuous left V1 segment. The left vertebral is patent and within normal limits to the skull base. CTA HEAD Posterior circulation: The distal right vertebral artery is mildly dominant especially beyond the left PICA origin. No distal vertebral stenosis. Patent vertebrobasilar junction and basilar artery without stenosis. The right AICA appears dominant. Normal SCA and PCA origins. Posterior communicating arteries are diminutive or absent. Bilateral PCA branches are within normal limits. Anterior circulation:  Both ICA siphons are patent. Moderate calcified plaque of the left siphon cavernous segment with associated moderate to severe stenosis in the distal cavernous segment (radiographic string sign series 6, image 93 and series 7, image 105 just proximal to the anterior genu. Supraclinoid left ICA remains patent and within normal limits. Mild to moderate right cavernous ICA calcified plaque and mild to moderate stenosis (series 5, image 109). Patent and negative supraclinoid right ICA. Patent carotid termini, MCA and ACA origins. Anterior communicating artery is diminutive or absent. Bilateral ACA branches are within normal limits. Left MCA M1 segment and bifurcation are patent without stenosis. Right MCA M1 segment and bifurcation are patent without stenosis. Bilateral MCA branches are within normal limits. Venous sinuses:  Patent. Anatomic variants: Mildly dominant right vertebral artery. Review of the MIP images confirms the above findings IMPRESSION: 1. Negative for large vessel occlusion. No core infarct detected by CT Perfusion. Stable and negative plain CT appearance of the brain. 2. 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 There is contralateral mild to moderate right ICA siphon plaque and stenosis. 3. But there is very little extracranial atherosclerosis, with no significant vertebral artery or cervical carotid stenosis. Electronically Signed: By: Genevie Ann M.D. On: 05/28/2020 18:32    Medications:  I have reviewed the patient's current medications. Scheduled: . amLODipine  5 mg Oral Daily  . aspirin  300 mg Rectal Daily   Or  . aspirin  325 mg Oral Daily  . atorvastatin  40 mg Oral Daily  . enoxaparin (LOVENOX) injection  40 mg Subcutaneous Q24H  . gabapentin  300 mg Oral TID  . insulin aspart  0-9 Units Subcutaneous Q4H    Assessment/Plan: 59 y.o.femalewith a history of CAD, HTN and DM presenting with left sided headache and left sided "heaviness". On neurological examination noted to have some mild LLE weakness that is unclear if related to pain. At baseline has right sided complaints from her back pathology. Head CT personally reviewed and shows no acute changes. CTA of the head and neck personally reviewed as well. No evidence of LVO. Severe stenosis noted at the left ICA for which patient would be considered asymptomatic. Mild to moderate ICA stenosis.  Patient on ASA daily.  MRI of the brain without contrast personally reviewed and shows no acute changes.  MRI of the cervical, thoracic and lumbar spines then performed to rule out a cord pathology.  These films personally reviewed as well.  There is noted continued presence of cervical pathology but no new changes.   Patient with history of chronic pain.  Suspect current symptoms  related to worsening of same.    Stroke Risk Factors -diabetes mellitus and hypertension  Plan: 1. ESR, CRP pending 2. PT consult, OT consult, Speech consult 3. Continue ASA 4. Vascular to see patient for asymptomatic left ICA stenosis 5. Would increase Neurontin to $RemoveBefo'400mg'[id number]$  TID for pain  6. Patient to follow up on an outpatient basis  No further neurologic intervention is recommended at this time.  If further questions arise, please call or page at that time.  Thank you for allowing neurology to participate in the care of this patient.    LOS: 1 day   Alexis Goodell, MD Neurology  05/30/2020  10:17 AM

## 2020-05-30 NOTE — Progress Notes (Addendum)
Inpatient Diabetes Program Recommendations  AACE/ADA: New Consensus Statement on Inpatient Glycemic Control (2015)  Target Ranges:  Prepandial:   less than 140 mg/dL      Peak postprandial:   less than 180 mg/dL (1-2 hours)      Critically ill patients:  140 - 180 mg/dL   Lab Results  Component Value Date   GLUCAP 228 (H) 05/30/2020   HGBA1C 11.8 (H) 05/29/2020    Review of Glycemic Control Results for LEANAH, KOLANDER (MRN [id number]) as of 05/30/2020 13:41  Ref. Range 05/29/2020 23:57 05/30/2020 01:20 05/30/2020 04:00 05/30/2020 07:51 05/30/2020 11:40  Glucose-Capillary Latest Ref Range: 70 - 99 mg/dL 207 (H) 195 (H) 177 (H) 171 (H) 228 (H)   Diabetes history: DM 2 Outpatient Diabetes medications:  Amaryl 2 mg daily, Metformin 500 mg bid Current orders for Inpatient glycemic control:  Novolog sensitive q 4 hours  Inpatient Diabetes Program Recommendations:    Please add Lantus 24 units daily.  Also consider changing Novolog correction to tid with meals with HS scale.  She will likely need meal coverage as well.  Consider Novolog 4 units tid with meals (hold if patient eats less than 50% or NPO).  Will discuss A1C with patient today.   Thanks  Adah Perl, RN, BC-ADM Inpatient Diabetes Coordinator Pager [phone] (8a-5p)  Addendum: Spoke with patient regarding A1C of 11.8%.  She states that no matter what she does, her blood sugars are high.  We discussed potential need for insulin at discharge and she said "I agree".  Showed patient insulin pen.  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  Ordered Insulin pen starter kit for patient as well.  Demonstrated use of insulin pen including applying insulin pen needle, 2 unit prime, administering dose, and holding in place for 6-10 seconds as well as the importance of rotating sites. Discussed hypoglycemia signs, symptoms and treatment as well.  She states that she now has BC[employer]BS  insurance and insulin pens should be covered.    At d/c consider Lantus solostar pen (order [phone]) and insulin pen needles (order [phone]).   Should be able to continue metformin and Amaryl as well. Patient has meter/strips at home.  Will follow.   Thanks  Adah Perl, RN, BC-ADM Inpatient Diabetes Coordinator Pager [phone] (8a-5p)

## 2020-05-30 NOTE — Progress Notes (Addendum)
PROGRESS NOTE    Ercel Normoyle  TGG:[id number] DOB: 1960-08-27 DOA: 05/28/2020 PCP: Patient, No Pcp Per    Brief Narrative:  Lennie Odor a 59 y.o.femalewith medical history significant of migraines, DM2, HTN Cervical radiculopathy, CAD Presented withSevere headache since 3 AM Developed some speech problems and heavy ness on the left In afternoon developed left side weakness She initially took Neurontin and Robaxin to help with her headache but with no improvement.  10/15-high wide-complex tachycardia, NS VT?  Yesterday cardiology was consulted work-up pending   Consultants:   Urology, neurology  Procedures: CT, MRI, echo  Antimicrobials:       Subjective: Still with LT side "heaviness" . No new complaints.   Objective: Vitals:   05/29/20 1959 05/29/20 2355 05/29/20 2358 05/30/20 0748  BP: (!) 158/83 138/73 138/73 (!) 155/97  Pulse: 71 70 68 76  Resp: 17 19 19 15   Temp: 98.8 F (37.1 C) 98.1 F (36.7 C) 98.1 F (36.7 C) 98 F (36.7 C)  TempSrc: Oral Oral Oral Oral  SpO2: 97% 98% 98% 99%  Weight:      Height:        Intake/Output Summary (Last 24 hours) at 05/30/2020 0804 Last data filed at 05/30/2020 0442 Gross per 24 hour  Intake 691.55 ml  Output 1 ml  Net 690.55 ml   Filed Weights   05/28/20 1718  Weight: 118.2 kg    Examination:  General exam: Appears calm and comfortable  Respiratory system: Clear to auscultation. Respiratory effort normal. Cardiovascular system: S1 & S2 heard, RRR. No JVD, murmurs, rubs, gallops or clicks. No pedal edema. Gastrointestinal system: Abdomen is nondistended, soft and nontenderNormal bowel sounds heard. Central nervous system: Alert and oriented. Grossly intact, with Lt strenght 4/5. 5/5 rt strength Extremities no edema Skin: warm, dry Psychiatry: Judgement and insight appear normal. Mood & affect appropriate.     Data Reviewed: I have personally reviewed following labs and imaging  studies  CBC: Recent Labs  Lab 05/28/20 1730 05/29/20 1623  WBC 6.3 5.6  NEUTROABS 2.8  --   HGB 12.7 11.8*  HCT 42.5 39.9  MCV 66.8* 67.4*  PLT 196 178   Basic Metabolic Panel: Recent Labs  Lab 05/28/20 1730 05/29/20 1623  NA 138  --   K 3.8  --   CL 100  --   CO2 27  --   GLUCOSE 215*  --   BUN 6  --   CREATININE 0.86 1.07*  CALCIUM 9.8  --    GFR: Estimated Creatinine Clearance: 74.6 mL/min (A) (by C-G formula based on SCr of 1.07 mg/dL (H)). Liver Function Tests: Recent Labs  Lab 05/28/20 1730  AST 18  ALT 14  ALKPHOS 72  BILITOT 0.6  PROT 8.5*  ALBUMIN 4.2   No results for input(s): LIPASE, AMYLASE in the last 168 hours. No results for input(s): AMMONIA in the last 168 hours. Coagulation Profile: Recent Labs  Lab 05/28/20 1730  INR 1.0   Cardiac Enzymes: No results for input(s): CKTOTAL, CKMB, CKMBINDEX, TROPONINI in the last 168 hours. BNP (last 3 results) No results for input(s): PROBNP in the last 8760 hours. HbA1C: Recent Labs    05/29/20 0310  HGBA1C 11.8*   CBG: Recent Labs  Lab 05/29/20 2001 05/29/20 2038 05/29/20 2357 05/30/20 0120 05/30/20 0400  GLUCAP 206* 239* 207* 195* 177*   Lipid Profile: Recent Labs    05/29/20 1514  CHOL 178  HDL 37*  LDLCALC 107*  TRIG 170*  CHOLHDL 4.8   Thyroid Function Tests: No results for input(s): TSH, T4TOTAL, FREET4, T3FREE, THYROIDAB in the last 72 hours. Anemia Panel: No results for input(s): VITAMINB12, FOLATE, FERRITIN, TIBC, IRON, RETICCTPCT in the last 72 hours. Sepsis Labs: No results for input(s): PROCALCITON, LATICACIDVEN in the last 168 hours.  Recent Results (from the past 240 hour(s))  Respiratory Panel by RT PCR (Flu A&B, Covid) - Nasopharyngeal Swab     Status: None   Collection Time: 05/29/20  3:10 AM   Specimen: Nasopharyngeal Swab  Result Value Ref Range Status   SARS Coronavirus 2 by RT PCR NEGATIVE NEGATIVE Final    Comment: (NOTE) SARS-CoV-2 target nucleic  acids are NOT DETECTED.  The SARS-CoV-2 RNA is generally detectable in upper respiratoy specimens during the acute phase of infection. The lowest concentration of SARS-CoV-2 viral copies this assay can detect is 131 copies/mL. A negative result does not preclude SARS-Cov-2 infection and should not be used as the sole basis for treatment or other patient management decisions. A negative result may occur with  improper specimen collection/handling, submission of specimen other than nasopharyngeal swab, presence of viral mutation(s) within the areas targeted by this assay, and inadequate number of viral copies (<131 copies/mL). A negative result must be combined with clinical observations, patient history, and epidemiological information. The expected result is Negative.  Fact Sheet for Patients:  https://www.moore.com/  Fact Sheet for Healthcare Providers:  https://www.young.biz/  This test is no t yet approved or cleared by the [place] FDA and  has been authorized for detection and/or diagnosis of SARS-CoV-2 by FDA under an Emergency Use Authorization (EUA). This EUA will remain  in effect (meaning this test can be used) for the duration of the COVID-19 declaration under Section 564(b)(1) of the Act, 21 U.S.C. section 360bbb-3(b)(1), unless the authorization is terminated or revoked sooner.     Influenza A by PCR NEGATIVE NEGATIVE Final   Influenza B by PCR NEGATIVE NEGATIVE Final    Comment: (NOTE) The Xpert Xpress SARS-CoV-2/FLU/RSV assay is intended as an aid in  the diagnosis of influenza from Nasopharyngeal swab specimens and  should not be used as a sole basis for treatment. Nasal washings and  aspirates are unacceptable for Xpert Xpress SARS-CoV-2/FLU/RSV  testing.  Fact Sheet for Patients: https://www.moore.com/  Fact Sheet for Healthcare Providers: https://www.young.biz/  This test  is not yet approved or cleared by the [place] FDA and  has been authorized for detection and/or diagnosis of SARS-CoV-2 by  FDA under an Emergency Use Authorization (EUA). This EUA will remain  in effect (meaning this test can be used) for the duration of the  Covid-19 declaration under Section 564(b)(1) of the Act, 21  U.S.C. section 360bbb-3(b)(1), unless the authorization is  terminated or revoked. Performed at [facility], [address], [place], [place] [address]          Radiology Studies: CT Angio Head W or Wo Contrast  Addendum Date: 05/28/2020   ADDENDUM REPORT: 05/28/2020 18:58 ADDENDUM: Study discussed by telephone with Dr. Fanny Bien on 05/28/2020 at 1837 hours. Electronically Signed   By: Odessa Fleming M.D.   On: 05/28/2020 18:58   Result Date: 05/28/2020 CLINICAL DATA:  Code stroke. 59 year old female with sudden onset left side weakness and heaviness. EXAM: CT HEAD WITHOUT CONTRAST CT ANGIOGRAPHY HEAD AND NECK CT PERFUSION BRAIN TECHNIQUE: Multidetector CT imaging of the head, and CTA head and neck was performed using the standard protocol during bolus administration of intravenous contrast. Multiplanar  CT image reconstructions and MIPs were obtained to evaluate the vascular anatomy. Carotid stenosis measurements (when applicable) are obtained utilizing NASCET criteria, using the distal internal carotid diameter as the denominator. Multiphase CT imaging of the brain was performed following IV bolus contrast injection. Subsequent parametric perfusion maps were calculated using RAPID software. CONTRAST:  OMNIPAQUE IOHEXOL 350 MG/ML SOLN COMPARISON:  Head CT 05/26/2019. FINDINGS: CT HEAD FINDINGS Brain: Cerebral volume is stable and within normal limits for age. No midline shift, ventriculomegaly, mass effect, evidence of mass lesion, intracranial hemorrhage or evidence of cortically based acute infarction. Gray-white matter differentiation is within normal limits  throughout the brain. Chronic partially empty sella. No cortical encephalomalacia identified. Vascular: Calcified atherosclerosis at the skull base. No suspicious intracranial vascular hyperdensity. Skull: No acute osseous abnormality identified. Sinuses/Orbits: Visualized paranasal sinuses and mastoids are stable and well pneumatized. Other: Visualized orbits and scalp soft tissues are within normal limits. ASPECTS [facility] Stroke Program Early CT Score) Total score (0-10 with 10 being normal): 10 Review of the MIP images confirms the above findings CT Brain Perfusion Findings: ASPECTS: 10 CBF (<30%) Volume: 0mL Perfusion (Tmax>6.0s) volume: 3mL (anterior inferior left temporal lobe, likely artifact) Mismatch Volume: 3mL Infarction Location:Not applicable CTA NECK Skeleton: Absent dentition. Cervical disc and endplate degeneration. No acute osseous abnormality identified. Upper chest: Negative. Other neck: Negative. Aortic arch: 3 vessel arch configuration with mild great vessel origin atherosclerosis. Right carotid system: Soft and calcified plaque in the proximal brachiocephalic artery but no significant stenosis. Negative right CCA origin. Mildly tortuous right CCA. Calcified plaque at the right ICA origin without stenosis. Tortuous vessel distal to the bulb with a kinked appearance (series 9, image 22). Left carotid system: Mild plaque at the left CCA origin without stenosis. Tortuous left CCA. No significant plaque at the left carotid bifurcation or proximal ICA. Tortuous left ICA distal to the bulb similar to the right side with a kinked appearance. Vertebral arteries: Normal proximal right subclavian artery and right vertebral artery origin. The right vertebral artery is patent and within normal limits to the skull base. Mild plaque in the proximal left subclavian artery without stenosis. Normal left vertebral artery origin. Tortuous left V1 segment. The left vertebral is patent and within normal limits to  the skull base. CTA HEAD Posterior circulation: The distal right vertebral artery is mildly dominant especially beyond the left PICA origin. No distal vertebral stenosis. Patent vertebrobasilar junction and basilar artery without stenosis. The right AICA appears dominant. Normal SCA and PCA origins. Posterior communicating arteries are diminutive or absent. Bilateral PCA branches are within normal limits. Anterior circulation: Both ICA siphons are patent. Moderate calcified plaque of the left siphon cavernous segment with associated moderate to severe stenosis in the distal cavernous segment (radiographic string sign series 6, image 93 and series 7, image 105 just proximal to the anterior genu. Supraclinoid left ICA remains patent and within normal limits. Mild to moderate right cavernous ICA calcified plaque and mild to moderate stenosis (series 5, image 109). Patent and negative supraclinoid right ICA. Patent carotid termini, MCA and ACA origins. Anterior communicating artery is diminutive or absent. Bilateral ACA branches are within normal limits. Left MCA M1 segment and bifurcation are patent without stenosis. Right MCA M1 segment and bifurcation are patent without stenosis. Bilateral MCA branches are within normal limits. Venous sinuses: Patent. Anatomic variants: Mildly dominant right vertebral artery. Review of the MIP images confirms the above findings IMPRESSION: 1. Negative for large vessel occlusion. No core infarct detected  by CT Perfusion. Stable and negative plain CT appearance of the brain. 2. 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 There is contralateral mild to moderate right ICA siphon plaque and stenosis. 3. But there is very little extracranial atherosclerosis, with no significant vertebral artery or cervical carotid stenosis. Electronically Signed: By: Odessa Fleming M.D. On: 05/28/2020 18:32   DG Chest 2 View  Result Date:  05/28/2020 CLINICAL DATA:  Left-sided weakness and headache.  Hypertension EXAM: CHEST - 2 VIEW COMPARISON:  CT angiogram chest July 26, 2019 FINDINGS: Lungs are clear. There is cardiomegaly with pulmonary vascularity normal. No adenopathy. No bone lesions. IMPRESSION: Cardiomegaly.  Lungs clear.  No adenopathy evident. Electronically Signed   By: Bretta Bang III M.D.   On: 05/28/2020 19:27   CT Angio Neck W and/or Wo Contrast  Addendum Date: 05/28/2020   ADDENDUM REPORT: 05/28/2020 18:58 ADDENDUM: Study discussed by telephone with Dr. Fanny Bien on 05/28/2020 at 1837 hours. Electronically Signed   By: Odessa Fleming M.D.   On: 05/28/2020 18:58   Result Date: 05/28/2020 CLINICAL DATA:  Code stroke. 59 year old female with sudden onset left side weakness and heaviness. EXAM: CT HEAD WITHOUT CONTRAST CT ANGIOGRAPHY HEAD AND NECK CT PERFUSION BRAIN TECHNIQUE: Multidetector CT imaging of the head, and CTA head and neck was performed using the standard protocol during bolus administration of intravenous contrast. Multiplanar CT image reconstructions and MIPs were obtained to evaluate the vascular anatomy. Carotid stenosis measurements (when applicable) are obtained utilizing NASCET criteria, using the distal internal carotid diameter as the denominator. Multiphase CT imaging of the brain was performed following IV bolus contrast injection. Subsequent parametric perfusion maps were calculated using RAPID software. CONTRAST:  OMNIPAQUE IOHEXOL 350 MG/ML SOLN COMPARISON:  Head CT 05/26/2019. FINDINGS: CT HEAD FINDINGS Brain: Cerebral volume is stable and within normal limits for age. No midline shift, ventriculomegaly, mass effect, evidence of mass lesion, intracranial hemorrhage or evidence of cortically based acute infarction. Gray-white matter differentiation is within normal limits throughout the brain. Chronic partially empty sella. No cortical encephalomalacia identified. Vascular: Calcified  atherosclerosis at the skull base. No suspicious intracranial vascular hyperdensity. Skull: No acute osseous abnormality identified. Sinuses/Orbits: Visualized paranasal sinuses and mastoids are stable and well pneumatized. Other: Visualized orbits and scalp soft tissues are within normal limits. ASPECTS [facility] Stroke Program Early CT Score) Total score (0-10 with 10 being normal): 10 Review of the MIP images confirms the above findings CT Brain Perfusion Findings: ASPECTS: 10 CBF (<30%) Volume: 0mL Perfusion (Tmax>6.0s) volume: 3mL (anterior inferior left temporal lobe, likely artifact) Mismatch Volume: 3mL Infarction Location:Not applicable CTA NECK Skeleton: Absent dentition. Cervical disc and endplate degeneration. No acute osseous abnormality identified. Upper chest: Negative. Other neck: Negative. Aortic arch: 3 vessel arch configuration with mild great vessel origin atherosclerosis. Right carotid system: Soft and calcified plaque in the proximal brachiocephalic artery but no significant stenosis. Negative right CCA origin. Mildly tortuous right CCA. Calcified plaque at the right ICA origin without stenosis. Tortuous vessel distal to the bulb with a kinked appearance (series 9, image 22). Left carotid system: Mild plaque at the left CCA origin without stenosis. Tortuous left CCA. No significant plaque at the left carotid bifurcation or proximal ICA. Tortuous left ICA distal to the bulb similar to the right side with a kinked appearance. Vertebral arteries: Normal proximal right subclavian artery and right vertebral artery origin. The right vertebral artery is patent and within normal limits to  the skull base. Mild plaque in the proximal left subclavian artery without stenosis. Normal left vertebral artery origin. Tortuous left V1 segment. The left vertebral is patent and within normal limits to the skull base. CTA HEAD Posterior circulation: The distal right vertebral artery is mildly dominant especially  beyond the left PICA origin. No distal vertebral stenosis. Patent vertebrobasilar junction and basilar artery without stenosis. The right AICA appears dominant. Normal SCA and PCA origins. Posterior communicating arteries are diminutive or absent. Bilateral PCA branches are within normal limits. Anterior circulation: Both ICA siphons are patent. Moderate calcified plaque of the left siphon cavernous segment with associated moderate to severe stenosis in the distal cavernous segment (radiographic string sign series 6, image 93 and series 7, image 105 just proximal to the anterior genu. Supraclinoid left ICA remains patent and within normal limits. Mild to moderate right cavernous ICA calcified plaque and mild to moderate stenosis (series 5, image 109). Patent and negative supraclinoid right ICA. Patent carotid termini, MCA and ACA origins. Anterior communicating artery is diminutive or absent. Bilateral ACA branches are within normal limits. Left MCA M1 segment and bifurcation are patent without stenosis. Right MCA M1 segment and bifurcation are patent without stenosis. Bilateral MCA branches are within normal limits. Venous sinuses: Patent. Anatomic variants: Mildly dominant right vertebral artery. Review of the MIP images confirms the above findings IMPRESSION: 1. Negative for large vessel occlusion. No core infarct detected by CT Perfusion. Stable and negative plain CT appearance of the brain. 2. 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 There is contralateral mild to moderate right ICA siphon plaque and stenosis. 3. But there is very little extracranial atherosclerosis, with no significant vertebral artery or cervical carotid stenosis. Electronically Signed: By: Odessa FlemingH  Hall M.D. On: 05/28/2020 18:32   MR BRAIN WO CONTRAST  Result Date: 05/29/2020 CLINICAL DATA:  Neuro deficit, acute, stroke suspected EXAM: MRI HEAD WITHOUT CONTRAST TECHNIQUE:  Multiplanar, multiecho pulse sequences of the brain and surrounding structures were obtained without intravenous contrast. COMPARISON:  Brain MRI 05/29/2020 at 1:40 p.m. FINDINGS: Brain: No acute infarct, acute hemorrhage or extra-axial collection. A few scattered foci of T2WI-hyperintensity in the white matter, nonspecific. Normal volume of CSF spaces. No chronic microhemorrhage. Normal midline structures. Vascular: Normal flow voids. Skull and upper cervical spine: Normal marrow signal. Sinuses/Orbits: Negative. Other: None. IMPRESSION: Normal MRI of the brain. Electronically Signed   By: Deatra RobinsonKevin  Herman M.D.   On: 05/29/2020 22:56   MR BRAIN WO CONTRAST  Result Date: 05/29/2020 CLINICAL DATA:  Abnormal speech, left-sided weakness EXAM: MRI HEAD WITHOUT CONTRAST TECHNIQUE: Multiplanar, multiecho pulse sequences of the brain and surrounding structures were obtained without intravenous contrast. COMPARISON:  None. FINDINGS: Brain: There is no acute infarction or intracranial hemorrhage. There is no intracranial mass, mass effect, or edema. There is no hydrocephalus or extra-axial fluid collection. There are minimal small foci of T2 hyperintensity in the supratentorial white matter likely reflecting nonspecific gliosis/demyelination potentially due to chronic microvascular ischemic changes in a patient of this age. Ventricles and sulci are normal in size and configuration. Vascular: Major vessel flow voids at the skull base are preserved. Skull and upper cervical spine: Normal marrow signal is preserved. Sinuses/Orbits: Paranasal sinuses are aerated. Orbits are unremarkable. Other: Sella is partially empty.  Mastoid air cells are clear. IMPRESSION: No evidence of recent infarction, hemorrhage, or mass. Probable minor chronic microvascular ischemic changes. Electronically Signed   By: Jackquline BerlinPraneil  Patel M.D.  On: 05/29/2020 14:19   MR CERVICAL SPINE WO CONTRAST  Result Date: 05/29/2020 CLINICAL DATA:  Low back  pain and progressive neurologic deficits. EXAM: MRI CERVICAL, THORACIC AND LUMBAR SPINE WITHOUT CONTRAST TECHNIQUE: Multiplanar and multiecho pulse sequences of the cervical spine, to include the craniocervical junction and cervicothoracic junction, and thoracic and lumbar spine, were obtained without intravenous contrast. COMPARISON:  Cervical spine MRI 09/05/2019 FINDINGS: MRI CERVICAL SPINE FINDINGS Alignment: Grade 1 retrolisthesis at C5-6. Straightening of normal cervical lordosis. Vertebrae: No acute abnormality. Cord: Hyperintense T2-weighted signal within the spinal cord at the C4-5 level, unchanged. Posterior Fossa, vertebral arteries, paraspinal tissues: Negative. Disc levels: C1-2: Unremarkable. C2-3: Normal disc space and facet joints. There is no spinal canal stenosis. No neural foraminal stenosis. C3-4: Normal disc space and facet joints. There is no spinal canal stenosis. No neural foraminal stenosis. C4-5: Intermediate sized disc bulge. Moderate spinal canal stenosis. Mild right neural foraminal stenosis. C5-6: Right-greater-than-left uncovertebral hypertrophy and small disc bulge. There is no spinal canal stenosis. Moderate right and mild left neural foraminal stenosis. C6-7: Right uncovertebral hypertrophy. There is no spinal canal stenosis. Moderate right neural foraminal stenosis. C7-T1: Normal disc space and facet joints. There is no spinal canal stenosis. No neural foraminal stenosis. MRI THORACIC SPINE FINDINGS Alignment:  Physiologic. Vertebrae: No fracture, evidence of discitis, or bone lesion. Cord:  Normal signal and morphology. Paraspinal and other soft tissues: Negative. Disc levels: Mild degenerative disc disease without spinal canal or neural foraminal stenosis. MRI LUMBAR SPINE FINDINGS Segmentation:  Standard. Alignment:  Physiologic. Vertebrae:  No fracture, evidence of discitis, or bone lesion. Conus medullaris and cauda equina: Conus extends to the L1 level. Conus and cauda equina  appear normal. Paraspinal and other soft tissues: Negative. Disc levels: Disc levels above L5 are normal. L5-S1: Disc space narrowing with endplate spurring and intermediate left asymmetric bulge. There is narrowing of the lateral recesses and severe left foraminal stenosis. Mild right foraminal stenosis. IMPRESSION: 1. No acute abnormality of the cervical, thoracic or lumbar spine. 2. Unchanged hyperintense T2-weighted signal within the spinal cord at the C4-5 level is consistent with myelomalacia. 3. Moderate right C5-6 and C6-7 neural foraminal stenosis. 4. Left asymmetric disc bulge at L5-S1 with narrowing of the lateral recesses and severe left neural foraminal stenosis. Electronically Signed   By: Deatra Robinson M.D.   On: 05/29/2020 22:42   MR THORACIC SPINE WO CONTRAST  Result Date: 05/29/2020 CLINICAL DATA:  Low back pain and progressive neurologic deficits. EXAM: MRI CERVICAL, THORACIC AND LUMBAR SPINE WITHOUT CONTRAST TECHNIQUE: Multiplanar and multiecho pulse sequences of the cervical spine, to include the craniocervical junction and cervicothoracic junction, and thoracic and lumbar spine, were obtained without intravenous contrast. COMPARISON:  Cervical spine MRI 09/05/2019 FINDINGS: MRI CERVICAL SPINE FINDINGS Alignment: Grade 1 retrolisthesis at C5-6. Straightening of normal cervical lordosis. Vertebrae: No acute abnormality. Cord: Hyperintense T2-weighted signal within the spinal cord at the C4-5 level, unchanged. Posterior Fossa, vertebral arteries, paraspinal tissues: Negative. Disc levels: C1-2: Unremarkable. C2-3: Normal disc space and facet joints. There is no spinal canal stenosis. No neural foraminal stenosis. C3-4: Normal disc space and facet joints. There is no spinal canal stenosis. No neural foraminal stenosis. C4-5: Intermediate sized disc bulge. Moderate spinal canal stenosis. Mild right neural foraminal stenosis. C5-6: Right-greater-than-left uncovertebral hypertrophy and small disc  bulge. There is no spinal canal stenosis. Moderate right and mild left neural foraminal stenosis. C6-7: Right uncovertebral hypertrophy. There is no spinal canal stenosis. Moderate right neural foraminal stenosis.  C7-T1: Normal disc space and facet joints. There is no spinal canal stenosis. No neural foraminal stenosis. MRI THORACIC SPINE FINDINGS Alignment:  Physiologic. Vertebrae: No fracture, evidence of discitis, or bone lesion. Cord:  Normal signal and morphology. Paraspinal and other soft tissues: Negative. Disc levels: Mild degenerative disc disease without spinal canal or neural foraminal stenosis. MRI LUMBAR SPINE FINDINGS Segmentation:  Standard. Alignment:  Physiologic. Vertebrae:  No fracture, evidence of discitis, or bone lesion. Conus medullaris and cauda equina: Conus extends to the L1 level. Conus and cauda equina appear normal. Paraspinal and other soft tissues: Negative. Disc levels: Disc levels above L5 are normal. L5-S1: Disc space narrowing with endplate spurring and intermediate left asymmetric bulge. There is narrowing of the lateral recesses and severe left foraminal stenosis. Mild right foraminal stenosis. IMPRESSION: 1. No acute abnormality of the cervical, thoracic or lumbar spine. 2. Unchanged hyperintense T2-weighted signal within the spinal cord at the C4-5 level is consistent with myelomalacia. 3. Moderate right C5-6 and C6-7 neural foraminal stenosis. 4. Left asymmetric disc bulge at L5-S1 with narrowing of the lateral recesses and severe left neural foraminal stenosis. Electronically Signed   By: Deatra Robinson M.D.   On: 05/29/2020 22:42   MR LUMBAR SPINE WO CONTRAST  Result Date: 05/29/2020 CLINICAL DATA:  Low back pain and progressive neurologic deficits. EXAM: MRI CERVICAL, THORACIC AND LUMBAR SPINE WITHOUT CONTRAST TECHNIQUE: Multiplanar and multiecho pulse sequences of the cervical spine, to include the craniocervical junction and cervicothoracic junction, and thoracic and  lumbar spine, were obtained without intravenous contrast. COMPARISON:  Cervical spine MRI 09/05/2019 FINDINGS: MRI CERVICAL SPINE FINDINGS Alignment: Grade 1 retrolisthesis at C5-6. Straightening of normal cervical lordosis. Vertebrae: No acute abnormality. Cord: Hyperintense T2-weighted signal within the spinal cord at the C4-5 level, unchanged. Posterior Fossa, vertebral arteries, paraspinal tissues: Negative. Disc levels: C1-2: Unremarkable. C2-3: Normal disc space and facet joints. There is no spinal canal stenosis. No neural foraminal stenosis. C3-4: Normal disc space and facet joints. There is no spinal canal stenosis. No neural foraminal stenosis. C4-5: Intermediate sized disc bulge. Moderate spinal canal stenosis. Mild right neural foraminal stenosis. C5-6: Right-greater-than-left uncovertebral hypertrophy and small disc bulge. There is no spinal canal stenosis. Moderate right and mild left neural foraminal stenosis. C6-7: Right uncovertebral hypertrophy. There is no spinal canal stenosis. Moderate right neural foraminal stenosis. C7-T1: Normal disc space and facet joints. There is no spinal canal stenosis. No neural foraminal stenosis. MRI THORACIC SPINE FINDINGS Alignment:  Physiologic. Vertebrae: No fracture, evidence of discitis, or bone lesion. Cord:  Normal signal and morphology. Paraspinal and other soft tissues: Negative. Disc levels: Mild degenerative disc disease without spinal canal or neural foraminal stenosis. MRI LUMBAR SPINE FINDINGS Segmentation:  Standard. Alignment:  Physiologic. Vertebrae:  No fracture, evidence of discitis, or bone lesion. Conus medullaris and cauda equina: Conus extends to the L1 level. Conus and cauda equina appear normal. Paraspinal and other soft tissues: Negative. Disc levels: Disc levels above L5 are normal. L5-S1: Disc space narrowing with endplate spurring and intermediate left asymmetric bulge. There is narrowing of the lateral recesses and severe left foraminal  stenosis. Mild right foraminal stenosis. IMPRESSION: 1. No acute abnormality of the cervical, thoracic or lumbar spine. 2. Unchanged hyperintense T2-weighted signal within the spinal cord at the C4-5 level is consistent with myelomalacia. 3. Moderate right C5-6 and C6-7 neural foraminal stenosis. 4. Left asymmetric disc bulge at L5-S1 with narrowing of the lateral recesses and severe left neural foraminal stenosis. Electronically Signed  By: Deatra Robinson M.D.   On: 05/29/2020 22:42   CT CEREBRAL PERFUSION W CONTRAST  Addendum Date: 05/28/2020   ADDENDUM REPORT: 05/28/2020 18:58 ADDENDUM: Study discussed by telephone with Dr. Fanny Bien on 05/28/2020 at 1837 hours. Electronically Signed   By: Odessa Fleming M.D.   On: 05/28/2020 18:58   Result Date: 05/28/2020 CLINICAL DATA:  Code stroke. 59 year old female with sudden onset left side weakness and heaviness. EXAM: CT HEAD WITHOUT CONTRAST CT ANGIOGRAPHY HEAD AND NECK CT PERFUSION BRAIN TECHNIQUE: Multidetector CT imaging of the head, and CTA head and neck was performed using the standard protocol during bolus administration of intravenous contrast. Multiplanar CT image reconstructions and MIPs were obtained to evaluate the vascular anatomy. Carotid stenosis measurements (when applicable) are obtained utilizing NASCET criteria, using the distal internal carotid diameter as the denominator. Multiphase CT imaging of the brain was performed following IV bolus contrast injection. Subsequent parametric perfusion maps were calculated using RAPID software. CONTRAST:  OMNIPAQUE IOHEXOL 350 MG/ML SOLN COMPARISON:  Head CT 05/26/2019. FINDINGS: CT HEAD FINDINGS Brain: Cerebral volume is stable and within normal limits for age. No midline shift, ventriculomegaly, mass effect, evidence of mass lesion, intracranial hemorrhage or evidence of cortically based acute infarction. Gray-white matter differentiation is within normal limits throughout the brain. Chronic partially empty  sella. No cortical encephalomalacia identified. Vascular: Calcified atherosclerosis at the skull base. No suspicious intracranial vascular hyperdensity. Skull: No acute osseous abnormality identified. Sinuses/Orbits: Visualized paranasal sinuses and mastoids are stable and well pneumatized. Other: Visualized orbits and scalp soft tissues are within normal limits. ASPECTS [facility] Stroke Program Early CT Score) Total score (0-10 with 10 being normal): 10 Review of the MIP images confirms the above findings CT Brain Perfusion Findings: ASPECTS: 10 CBF (<30%) Volume: 0mL Perfusion (Tmax>6.0s) volume: 3mL (anterior inferior left temporal lobe, likely artifact) Mismatch Volume: 3mL Infarction Location:Not applicable CTA NECK Skeleton: Absent dentition. Cervical disc and endplate degeneration. No acute osseous abnormality identified. Upper chest: Negative. Other neck: Negative. Aortic arch: 3 vessel arch configuration with mild great vessel origin atherosclerosis. Right carotid system: Soft and calcified plaque in the proximal brachiocephalic artery but no significant stenosis. Negative right CCA origin. Mildly tortuous right CCA. Calcified plaque at the right ICA origin without stenosis. Tortuous vessel distal to the bulb with a kinked appearance (series 9, image 22). Left carotid system: Mild plaque at the left CCA origin without stenosis. Tortuous left CCA. No significant plaque at the left carotid bifurcation or proximal ICA. Tortuous left ICA distal to the bulb similar to the right side with a kinked appearance. Vertebral arteries: Normal proximal right subclavian artery and right vertebral artery origin. The right vertebral artery is patent and within normal limits to the skull base. Mild plaque in the proximal left subclavian artery without stenosis. Normal left vertebral artery origin. Tortuous left V1 segment. The left vertebral is patent and within normal limits to the skull base. CTA HEAD Posterior circulation:  The distal right vertebral artery is mildly dominant especially beyond the left PICA origin. No distal vertebral stenosis. Patent vertebrobasilar junction and basilar artery without stenosis. The right AICA appears dominant. Normal SCA and PCA origins. Posterior communicating arteries are diminutive or absent. Bilateral PCA branches are within normal limits. Anterior circulation: Both ICA siphons are patent. Moderate calcified plaque of the left siphon cavernous segment with associated moderate to severe stenosis in the distal cavernous segment (radiographic string sign series 6, image 93 and series 7, image 105 just proximal to the  anterior genu. Supraclinoid left ICA remains patent and within normal limits. Mild to moderate right cavernous ICA calcified plaque and mild to moderate stenosis (series 5, image 109). Patent and negative supraclinoid right ICA. Patent carotid termini, MCA and ACA origins. Anterior communicating artery is diminutive or absent. Bilateral ACA branches are within normal limits. Left MCA M1 segment and bifurcation are patent without stenosis. Right MCA M1 segment and bifurcation are patent without stenosis. Bilateral MCA branches are within normal limits. Venous sinuses: Patent. Anatomic variants: Mildly dominant right vertebral artery. Review of the MIP images confirms the above findings IMPRESSION: 1. Negative for large vessel occlusion. No core infarct detected by CT Perfusion. Stable and negative plain CT appearance of the brain. 2. 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 There is contralateral mild to moderate right ICA siphon plaque and stenosis. 3. But there is very little extracranial atherosclerosis, with no significant vertebral artery or cervical carotid stenosis. Electronically Signed: By: Odessa Fleming M.D. On: 05/28/2020 18:32   CT HEAD CODE STROKE WO CONTRAST  Addendum Date: 05/28/2020   ADDENDUM REPORT:  05/28/2020 18:58 ADDENDUM: Study discussed by telephone with Dr. Fanny Bien on 05/28/2020 at 1837 hours. Electronically Signed   By: Odessa Fleming M.D.   On: 05/28/2020 18:58   Result Date: 05/28/2020 CLINICAL DATA:  Code stroke. 59 year old female with sudden onset left side weakness and heaviness. EXAM: CT HEAD WITHOUT CONTRAST CT ANGIOGRAPHY HEAD AND NECK CT PERFUSION BRAIN TECHNIQUE: Multidetector CT imaging of the head, and CTA head and neck was performed using the standard protocol during bolus administration of intravenous contrast. Multiplanar CT image reconstructions and MIPs were obtained to evaluate the vascular anatomy. Carotid stenosis measurements (when applicable) are obtained utilizing NASCET criteria, using the distal internal carotid diameter as the denominator. Multiphase CT imaging of the brain was performed following IV bolus contrast injection. Subsequent parametric perfusion maps were calculated using RAPID software. CONTRAST:  OMNIPAQUE IOHEXOL 350 MG/ML SOLN COMPARISON:  Head CT 05/26/2019. FINDINGS: CT HEAD FINDINGS Brain: Cerebral volume is stable and within normal limits for age. No midline shift, ventriculomegaly, mass effect, evidence of mass lesion, intracranial hemorrhage or evidence of cortically based acute infarction. Gray-white matter differentiation is within normal limits throughout the brain. Chronic partially empty sella. No cortical encephalomalacia identified. Vascular: Calcified atherosclerosis at the skull base. No suspicious intracranial vascular hyperdensity. Skull: No acute osseous abnormality identified. Sinuses/Orbits: Visualized paranasal sinuses and mastoids are stable and well pneumatized. Other: Visualized orbits and scalp soft tissues are within normal limits. ASPECTS [facility] Stroke Program Early CT Score) Total score (0-10 with 10 being normal): 10 Review of the MIP images confirms the above findings CT Brain Perfusion Findings: ASPECTS: 10 CBF (<30%) Volume: 43mL  Perfusion (Tmax>6.0s) volume: 36mL (anterior inferior left temporal lobe, likely artifact) Mismatch Volume: 19mL Infarction Location:Not applicable CTA NECK Skeleton: Absent dentition. Cervical disc and endplate degeneration. No acute osseous abnormality identified. Upper chest: Negative. Other neck: Negative. Aortic arch: 3 vessel arch configuration with mild great vessel origin atherosclerosis. Right carotid system: Soft and calcified plaque in the proximal brachiocephalic artery but no significant stenosis. Negative right CCA origin. Mildly tortuous right CCA. Calcified plaque at the right ICA origin without stenosis. Tortuous vessel distal to the bulb with a kinked appearance (series 9, image 22). Left carotid system: Mild plaque at the left CCA origin without stenosis. Tortuous left CCA. No significant plaque at the left carotid bifurcation or proximal ICA.  Tortuous left ICA distal to the bulb similar to the right side with a kinked appearance. Vertebral arteries: Normal proximal right subclavian artery and right vertebral artery origin. The right vertebral artery is patent and within normal limits to the skull base. Mild plaque in the proximal left subclavian artery without stenosis. Normal left vertebral artery origin. Tortuous left V1 segment. The left vertebral is patent and within normal limits to the skull base. CTA HEAD Posterior circulation: The distal right vertebral artery is mildly dominant especially beyond the left PICA origin. No distal vertebral stenosis. Patent vertebrobasilar junction and basilar artery without stenosis. The right AICA appears dominant. Normal SCA and PCA origins. Posterior communicating arteries are diminutive or absent. Bilateral PCA branches are within normal limits. Anterior circulation: Both ICA siphons are patent. Moderate calcified plaque of the left siphon cavernous segment with associated moderate to severe stenosis in the distal cavernous segment (radiographic string  sign series 6, image 93 and series 7, image 105 just proximal to the anterior genu. Supraclinoid left ICA remains patent and within normal limits. Mild to moderate right cavernous ICA calcified plaque and mild to moderate stenosis (series 5, image 109). Patent and negative supraclinoid right ICA. Patent carotid termini, MCA and ACA origins. Anterior communicating artery is diminutive or absent. Bilateral ACA branches are within normal limits. Left MCA M1 segment and bifurcation are patent without stenosis. Right MCA M1 segment and bifurcation are patent without stenosis. Bilateral MCA branches are within normal limits. Venous sinuses: Patent. Anatomic variants: Mildly dominant right vertebral artery. Review of the MIP images confirms the above findings IMPRESSION: 1. Negative for large vessel occlusion. No core infarct detected by CT Perfusion. Stable and negative plain CT appearance of the brain. 2. 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 There is contralateral mild to moderate right ICA siphon plaque and stenosis. 3. But there is very little extracranial atherosclerosis, with no significant vertebral artery or cervical carotid stenosis. Electronically Signed: By: Odessa Fleming M.D. On: 05/28/2020 18:32        Scheduled Meds: . amLODipine  5 mg Oral Daily  . aspirin  300 mg Rectal Daily   Or  . aspirin  325 mg Oral Daily  . atorvastatin  40 mg Oral Daily  . enoxaparin (LOVENOX) injection  40 mg Subcutaneous Q24H  . gabapentin  100 mg Oral TID  . insulin aspart  0-9 Units Subcutaneous Q4H   Continuous Infusions: . sodium chloride 75 mL/hr at 05/30/20 0718    Assessment & Plan:   Active Problems:   Stroke-like symptoms   Essential hypertension   Type 2 diabetes mellitus without complication (HCC)   Obesity, Class III, BMI 40-49.9 (morbid obesity) (HCC)   Stroke-like symptoms-- ruled out for acute infarct  CTA revealed  severe stenosis of left ICA  MRI-evidence of recent infarction, hemorrhage, or mass.  Probable minor chronic microvascular ischemic changes Vascular consulted.  Patient with positive for severe stenosis of intracranial left ICA. On aspirin and statin Will need to follow-up with vascular as outpatient with possible outpatient follow-up with neurovascular after there PT OT recommend home health Neurology following-recommend aspirin, increasing Neurontin 400 mg 3 times daily for pain, follow-up with neurology as outpatient MRI cervical/lumbar- unchanged, x4-5. Rt c5-6 and c6-7 with moderate foraminal stenosis. Left asymmetric disc bulge at L5-S1 with narrowing of the lateral recesses and severe left neural foraminal stenosis   WCT-starting for VT, unable to exclude other wide-complex rhythm  such as apparent tachycardia Cardiology consulted Patient appeared to be asymptomatic Has risk factors for cardiac disease Echo pending We will continue on monitor telemetry today Restarted her carvedilol at a lower dose increase as tolerated via blood pressure and heart rate Per cards may need 0 monitor at discharge as outpatient if no further telemetry evidence of wide-complex tach he Will need either ischemic work-up as outpatient versus cardiac CTA, but likely should get ischemic work-up    .Essential hypertension-still uncontrolled Continue with amlodipine Resume home carvedilol at lower dose 6.125 bid  .Obesity, Class III, BMI 40-49.9 (morbid obesity) (HCC)-  Complicates issues.   DM2 -- Uncontrolled , A1c 11.8 Pt was counseled about better DM control Diabetic educator-recommended Lantus 24 units daily and change NovoLog to 3 times daily with meals with at bedtime scale, consider NovoLog 4 units 3 times daily with meals       DVT prophylaxis: Lovenox Code Status: Full Family Communication: None at bedside  Status is: Inpatient  Remains inpatient appropriate  because:Ongoing diagnostic testing needed not appropriate for outpatient work up   Dispo: The patient is from: Home              Anticipated d/c is to: Home              Anticipated d/c date is: 1 day              Patient currently is not medically stable to d/c. w/u for VT pending.             LOS: 1 day   Time spent: 45 min with >50% on coc    Lynn Ito, MD [facility] Pager [phone]  If 7PM-7AM, please contact night-coverage [email or web address] Password [email or web address] 05/30/2020, 8:04 AM

## 2020-05-31 LAB — HEMOGLOBIN A1C
Hgb A1c MFr Bld: 11.9 % — ABNORMAL HIGH (ref 4.8–5.6)
Mean Plasma Glucose: 295 mg/dL

## 2020-06-03 ENCOUNTER — Telehealth: Payer: Self-pay | Admitting: *Deleted

## 2020-06-03 DIAGNOSIS — I2510 Atherosclerotic heart disease of native coronary artery without angina pectoris: Secondary | ICD-10-CM

## 2020-06-03 NOTE — Telephone Encounter (Signed)
-----   Message from Antonieta Iba, MD sent at 05/30/2020  6:07 PM EDT ----- Patient seen in the hospital for ventricular tachycardia run 35 beats Former smoker, has coronary calcification on CT scan, Concern for underlying ischemia Would recommend cardiac CTA  We can call her with the results once they are available

## 2020-06-03 NOTE — Telephone Encounter (Signed)
Left voicemail message to call back  

## 2020-06-03 NOTE — Telephone Encounter (Signed)
Left voicemail message to call back regarding request for additional testing.

## 2020-06-03 NOTE — Telephone Encounter (Signed)
I have marked the referral off for the patient. It looks like she will be self pay.

## 2020-07-15 NOTE — Telephone Encounter (Signed)
No answer/Voicemail box is full.  

## 2020-07-21 ENCOUNTER — Encounter: Payer: Self-pay | Admitting: *Deleted

## 2020-07-21 NOTE — Telephone Encounter (Signed)
Left voicemail message to call back  

## 2020-07-21 NOTE — Telephone Encounter (Signed)
Letter sent to patient requesting her to call our office.
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
